# Patient Record
Sex: Female | Born: 1935 | Race: White | Hispanic: No | Smoking: Never smoker
Health system: Southern US, Community
[De-identification: ages and names within clinical notes are randomized; demographics above are authoritative.]

## PROBLEM LIST (undated history)

## (undated) DIAGNOSIS — E079 Disorder of thyroid, unspecified: Secondary | ICD-10-CM

## (undated) DIAGNOSIS — M1990 Unspecified osteoarthritis, unspecified site: Secondary | ICD-10-CM

## (undated) DIAGNOSIS — F0390 Unspecified dementia without behavioral disturbance: Secondary | ICD-10-CM

## (undated) HISTORY — PX: OTHER SURGICAL HISTORY: SHX169

## (undated) HISTORY — PX: ABDOMINAL HYSTERECTOMY: SHX81

## (undated) HISTORY — PX: TONSILLECTOMY: SUR1361

## (undated) HISTORY — PX: EYE SURGERY: SHX253

## (undated) HISTORY — PX: APPENDECTOMY: SHX54

## (undated) HISTORY — PX: CHOLECYSTECTOMY: SHX55

---

## 2005-07-01 ENCOUNTER — Ambulatory Visit: Payer: Self-pay | Admitting: Internal Medicine

## 2006-07-06 ENCOUNTER — Ambulatory Visit: Payer: Self-pay | Admitting: Internal Medicine

## 2006-10-06 ENCOUNTER — Ambulatory Visit: Payer: Self-pay | Admitting: Internal Medicine

## 2007-07-12 ENCOUNTER — Ambulatory Visit: Payer: Self-pay | Admitting: Internal Medicine

## 2007-10-08 ENCOUNTER — Emergency Department: Payer: Self-pay | Admitting: Emergency Medicine

## 2007-10-08 ENCOUNTER — Other Ambulatory Visit: Payer: Self-pay

## 2008-07-12 ENCOUNTER — Ambulatory Visit: Payer: Self-pay | Admitting: Internal Medicine

## 2009-08-06 ENCOUNTER — Ambulatory Visit: Payer: Self-pay | Admitting: Unknown Physician Specialty

## 2009-11-05 ENCOUNTER — Ambulatory Visit: Payer: Self-pay | Admitting: Gastroenterology

## 2010-09-08 ENCOUNTER — Ambulatory Visit: Payer: Self-pay | Admitting: Unknown Physician Specialty

## 2012-09-07 ENCOUNTER — Ambulatory Visit: Payer: Self-pay | Admitting: Physician Assistant

## 2014-01-16 ENCOUNTER — Ambulatory Visit: Payer: Self-pay | Admitting: Physician Assistant

## 2015-03-14 ENCOUNTER — Other Ambulatory Visit: Payer: Self-pay | Admitting: Physician Assistant

## 2015-03-14 DIAGNOSIS — Z1231 Encounter for screening mammogram for malignant neoplasm of breast: Secondary | ICD-10-CM

## 2015-03-28 ENCOUNTER — Ambulatory Visit: Payer: Self-pay

## 2020-05-31 ENCOUNTER — Encounter: Payer: Self-pay | Admitting: Emergency Medicine

## 2020-05-31 ENCOUNTER — Other Ambulatory Visit: Payer: Self-pay

## 2020-05-31 DIAGNOSIS — I5021 Acute systolic (congestive) heart failure: Principal | ICD-10-CM | POA: Diagnosis present

## 2020-05-31 DIAGNOSIS — F0390 Unspecified dementia without behavioral disturbance: Secondary | ICD-10-CM | POA: Diagnosis present

## 2020-05-31 DIAGNOSIS — Z9114 Patient's other noncompliance with medication regimen: Secondary | ICD-10-CM

## 2020-05-31 DIAGNOSIS — N1831 Chronic kidney disease, stage 3a: Secondary | ICD-10-CM | POA: Diagnosis present

## 2020-05-31 DIAGNOSIS — M7989 Other specified soft tissue disorders: Secondary | ICD-10-CM | POA: Diagnosis present

## 2020-05-31 DIAGNOSIS — M1990 Unspecified osteoarthritis, unspecified site: Secondary | ICD-10-CM | POA: Diagnosis present

## 2020-05-31 DIAGNOSIS — Z7982 Long term (current) use of aspirin: Secondary | ICD-10-CM

## 2020-05-31 DIAGNOSIS — H409 Unspecified glaucoma: Secondary | ICD-10-CM | POA: Diagnosis present

## 2020-05-31 DIAGNOSIS — E039 Hypothyroidism, unspecified: Secondary | ICD-10-CM | POA: Diagnosis present

## 2020-05-31 DIAGNOSIS — Z79899 Other long term (current) drug therapy: Secondary | ICD-10-CM

## 2020-05-31 DIAGNOSIS — I509 Heart failure, unspecified: Secondary | ICD-10-CM | POA: Diagnosis not present

## 2020-05-31 DIAGNOSIS — J9601 Acute respiratory failure with hypoxia: Secondary | ICD-10-CM | POA: Diagnosis present

## 2020-05-31 DIAGNOSIS — Z9049 Acquired absence of other specified parts of digestive tract: Secondary | ICD-10-CM

## 2020-05-31 DIAGNOSIS — Z79890 Hormone replacement therapy (postmenopausal): Secondary | ICD-10-CM

## 2020-05-31 DIAGNOSIS — Z20822 Contact with and (suspected) exposure to covid-19: Secondary | ICD-10-CM | POA: Diagnosis present

## 2020-05-31 DIAGNOSIS — I272 Pulmonary hypertension, unspecified: Secondary | ICD-10-CM | POA: Diagnosis present

## 2020-05-31 LAB — CBC
HCT: 37.7 % (ref 36.0–46.0)
Hemoglobin: 12.4 g/dL (ref 12.0–15.0)
MCH: 31.6 pg (ref 26.0–34.0)
MCHC: 32.9 g/dL (ref 30.0–36.0)
MCV: 95.9 fL (ref 80.0–100.0)
Platelets: 225 10*3/uL (ref 150–400)
RBC: 3.93 MIL/uL (ref 3.87–5.11)
RDW: 13.9 % (ref 11.5–15.5)
WBC: 5.4 10*3/uL (ref 4.0–10.5)
nRBC: 0 % (ref 0.0–0.2)

## 2020-05-31 LAB — COMPREHENSIVE METABOLIC PANEL
ALT: 33 U/L (ref 0–44)
AST: 44 U/L — ABNORMAL HIGH (ref 15–41)
Albumin: 4.3 g/dL (ref 3.5–5.0)
Alkaline Phosphatase: 64 U/L (ref 38–126)
Anion gap: 11 (ref 5–15)
BUN: 16 mg/dL (ref 8–23)
CO2: 23 mmol/L (ref 22–32)
Calcium: 9.2 mg/dL (ref 8.9–10.3)
Chloride: 99 mmol/L (ref 98–111)
Creatinine, Ser: 1.02 mg/dL — ABNORMAL HIGH (ref 0.44–1.00)
GFR calc Af Amer: 55 mL/min — ABNORMAL LOW (ref >60)
GFR calc non Af Amer: 47 mL/min — ABNORMAL LOW (ref >60)
Glucose, Bld: 128 mg/dL — ABNORMAL HIGH (ref 70–99)
Potassium: 4.1 mmol/L (ref 3.5–5.1)
Sodium: 133 mmol/L — ABNORMAL LOW (ref 135–145)
Total Bilirubin: 1.5 mg/dL — ABNORMAL HIGH (ref 0.3–1.2)
Total Protein: 7.1 g/dL (ref 6.5–8.1)

## 2020-05-31 LAB — URINALYSIS, COMPLETE (UACMP) WITH MICROSCOPIC
Bilirubin Urine: NEGATIVE
Glucose, UA: NEGATIVE mg/dL
Ketones, ur: 5 mg/dL — AB
Leukocytes,Ua: NEGATIVE
Nitrite: NEGATIVE
Protein, ur: 30 mg/dL — AB
Specific Gravity, Urine: 1.014 (ref 1.005–1.030)
pH: 5 (ref 5.0–8.0)

## 2020-05-31 LAB — BRAIN NATRIURETIC PEPTIDE: B Natriuretic Peptide: 273 pg/mL — ABNORMAL HIGH (ref 0.0–100.0)

## 2020-05-31 MED ORDER — SODIUM CHLORIDE 0.9% FLUSH: 3.0000 mL | Freq: Once | INTRAVENOUS | Status: DC

## 2020-05-31 NOTE — ED Triage Notes (Signed)
Pt to ED via POV with family member who states that she took pt to [facility] today for Leg swelling and AMS. Family member states that pt has had increased confusion over the last 3 weeks. Also noticed today that pt has swelling from her knees to her ankles. Pt has dry cough in triage. Pt is in NAD.

## 2020-06-01 ENCOUNTER — Inpatient Hospital Stay (HOSPITAL_COMMUNITY)
Admit: 2020-06-01 | Discharge: 2020-06-01 | Disposition: A | Discharge status: Home / Self Care | Payer: Medicare Other | Attending: Family Medicine | Admitting: Family Medicine

## 2020-06-01 ENCOUNTER — Encounter: Payer: Self-pay | Admitting: Family Medicine

## 2020-06-01 ENCOUNTER — Inpatient Hospital Stay
Admission: EM | Admit: 2020-06-01 | Discharge: 2020-06-04 | DRG: 291 | Disposition: A | Discharge status: Home Health | Payer: Medicare Other | Attending: Internal Medicine | Admitting: Internal Medicine

## 2020-06-01 ENCOUNTER — Emergency Department: Payer: Medicare Other

## 2020-06-01 DIAGNOSIS — F0390 Unspecified dementia without behavioral disturbance: Secondary | ICD-10-CM | POA: Diagnosis present

## 2020-06-01 DIAGNOSIS — I361 Nonrheumatic tricuspid (valve) insufficiency: Secondary | ICD-10-CM

## 2020-06-01 DIAGNOSIS — E079 Disorder of thyroid, unspecified: Secondary | ICD-10-CM | POA: Diagnosis not present

## 2020-06-01 DIAGNOSIS — Z79890 Hormone replacement therapy (postmenopausal): Secondary | ICD-10-CM | POA: Diagnosis not present

## 2020-06-01 DIAGNOSIS — I509 Heart failure, unspecified: Secondary | ICD-10-CM | POA: Diagnosis present

## 2020-06-01 DIAGNOSIS — I5021 Acute systolic (congestive) heart failure: Secondary | ICD-10-CM | POA: Diagnosis present

## 2020-06-01 DIAGNOSIS — Z9049 Acquired absence of other specified parts of digestive tract: Secondary | ICD-10-CM | POA: Diagnosis not present

## 2020-06-01 DIAGNOSIS — J9601 Acute respiratory failure with hypoxia: Secondary | ICD-10-CM

## 2020-06-01 DIAGNOSIS — R0902 Hypoxemia: Secondary | ICD-10-CM

## 2020-06-01 DIAGNOSIS — I351 Nonrheumatic aortic (valve) insufficiency: Secondary | ICD-10-CM

## 2020-06-01 DIAGNOSIS — E039 Hypothyroidism, unspecified: Secondary | ICD-10-CM | POA: Diagnosis present

## 2020-06-01 DIAGNOSIS — N1831 Chronic kidney disease, stage 3a: Secondary | ICD-10-CM | POA: Diagnosis present

## 2020-06-01 DIAGNOSIS — I272 Pulmonary hypertension, unspecified: Secondary | ICD-10-CM | POA: Diagnosis present

## 2020-06-01 DIAGNOSIS — M7989 Other specified soft tissue disorders: Secondary | ICD-10-CM | POA: Diagnosis present

## 2020-06-01 DIAGNOSIS — Z79899 Other long term (current) drug therapy: Secondary | ICD-10-CM | POA: Diagnosis not present

## 2020-06-01 DIAGNOSIS — Z7982 Long term (current) use of aspirin: Secondary | ICD-10-CM | POA: Diagnosis not present

## 2020-06-01 DIAGNOSIS — Z9114 Patient's other noncompliance with medication regimen: Secondary | ICD-10-CM | POA: Diagnosis not present

## 2020-06-01 DIAGNOSIS — H409 Unspecified glaucoma: Secondary | ICD-10-CM | POA: Diagnosis present

## 2020-06-01 DIAGNOSIS — Z20822 Contact with and (suspected) exposure to covid-19: Secondary | ICD-10-CM | POA: Diagnosis present

## 2020-06-01 DIAGNOSIS — I340 Nonrheumatic mitral (valve) insufficiency: Secondary | ICD-10-CM

## 2020-06-01 DIAGNOSIS — M1990 Unspecified osteoarthritis, unspecified site: Secondary | ICD-10-CM | POA: Diagnosis present

## 2020-06-01 HISTORY — DX: Unspecified osteoarthritis, unspecified site: M19.90

## 2020-06-01 HISTORY — DX: Disorder of thyroid, unspecified: E07.9

## 2020-06-01 HISTORY — DX: Unspecified dementia, unspecified severity, without behavioral disturbance, psychotic disturbance, mood disturbance, and anxiety: F03.90

## 2020-06-01 LAB — HEPATIC FUNCTION PANEL
ALT: 33 U/L (ref 0–44)
AST: 47 U/L — ABNORMAL HIGH (ref 15–41)
Albumin: 4.5 g/dL (ref 3.5–5.0)
Alkaline Phosphatase: 67 U/L (ref 38–126)
Bilirubin, Direct: 0.5 mg/dL — ABNORMAL HIGH (ref 0.0–0.2)
Indirect Bilirubin: 1.7 mg/dL — ABNORMAL HIGH (ref 0.3–0.9)
Total Bilirubin: 2.2 mg/dL — ABNORMAL HIGH (ref 0.3–1.2)
Total Protein: 7.2 g/dL (ref 6.5–8.1)

## 2020-06-01 LAB — ECHOCARDIOGRAM COMPLETE
AR max vel: 1.94 cm2
AV Area VTI: 1.78 cm2
AV Area mean vel: 1.92 cm2
AV Mean grad: 2 mmHg
AV Peak grad: 3.4 mmHg
Ao pk vel: 0.92 m/s
S' Lateral: 3.66 cm

## 2020-06-01 LAB — MAGNESIUM: Magnesium: 2.1 mg/dL (ref 1.7–2.4)

## 2020-06-01 LAB — SARS CORONAVIRUS 2 BY RT PCR (HOSPITAL ORDER, PERFORMED IN ~~LOC~~ HOSPITAL LAB): SARS Coronavirus 2: NEGATIVE

## 2020-06-01 LAB — TSH: TSH: 2.299 u[IU]/mL (ref 0.350–4.500)

## 2020-06-01 LAB — AMMONIA: Ammonia: 11 umol/L (ref 9–35)

## 2020-06-01 LAB — VITAMIN B12: Vitamin B-12: 2956 pg/mL — ABNORMAL HIGH (ref 180–914)

## 2020-06-01 LAB — RPR: RPR Ser Ql: NONREACTIVE

## 2020-06-01 MED ORDER — HALOPERIDOL LACTATE 5 MG/ML IJ SOLN: 1.0000 mg | Freq: Four times a day (QID) | INTRAMUSCULAR | Status: DC | PRN

## 2020-06-01 MED ORDER — ASPIRIN EC 81 MG PO TBEC
81.0000 mg | DELAYED_RELEASE_TABLET | Freq: Every day | ORAL | Status: DC
  Administered 2020-06-01 – 2020-06-04 (×4): 81 mg via ORAL
  Filled 2020-06-01 (×4): qty 1

## 2020-06-01 MED ORDER — BRIMONIDINE TARTRATE 0.2 % OP SOLN
1.0000 [drp] | Freq: Two times a day (BID) | OPHTHALMIC | Status: DC
  Administered 2020-06-01 – 2020-06-04 (×7): 1 [drp] via OPHTHALMIC
  Filled 2020-06-01: qty 5

## 2020-06-01 MED ORDER — FUROSEMIDE 40 MG PO TABS
40.0000 mg | ORAL_TABLET | Freq: Two times a day (BID) | ORAL | Status: DC
  Administered 2020-06-01 – 2020-06-02 (×2): 40 mg via ORAL
  Filled 2020-06-01 (×2): qty 1

## 2020-06-01 MED ORDER — LEVOTHYROXINE SODIUM 50 MCG PO TABS
50.0000 ug | ORAL_TABLET | Freq: Every morning | ORAL | Status: DC
  Administered 2020-06-01 – 2020-06-04 (×4): 50 ug via ORAL
  Filled 2020-06-01 (×4): qty 1

## 2020-06-01 MED ORDER — ENOXAPARIN SODIUM 40 MG/0.4ML ~~LOC~~ SOLN
40.0000 mg | SUBCUTANEOUS | Status: DC
  Administered 2020-06-01 – 2020-06-02 (×2): 40 mg via SUBCUTANEOUS
  Filled 2020-06-01: qty 0.4

## 2020-06-01 MED ORDER — SODIUM CHLORIDE 0.9 % IV SOLN: 250.0000 mL | INTRAVENOUS | Status: DC | PRN

## 2020-06-01 MED ORDER — BRIMONIDINE TARTRATE-TIMOLOL 0.2-0.5 % OP SOLN
1.0000 [drp] | Freq: Two times a day (BID) | OPHTHALMIC | Status: DC
  Filled 2020-06-01: qty 5

## 2020-06-01 MED ORDER — HALOPERIDOL 1 MG PO TABS
1.0000 mg | ORAL_TABLET | Freq: Four times a day (QID) | ORAL | Status: DC | PRN
  Administered 2020-06-01: 1 mg via ORAL
  Filled 2020-06-01 (×2): qty 1

## 2020-06-01 MED ORDER — TIMOLOL MALEATE 0.5 % OP SOLN
1.0000 [drp] | Freq: Two times a day (BID) | OPHTHALMIC | Status: DC
  Administered 2020-06-01 – 2020-06-04 (×7): 1 [drp] via OPHTHALMIC
  Filled 2020-06-01: qty 5

## 2020-06-01 MED ORDER — SODIUM CHLORIDE 0.9% FLUSH: 3.0000 mL | INTRAVENOUS | Status: DC | PRN

## 2020-06-01 MED ORDER — ACETAMINOPHEN 325 MG PO TABS
650.0000 mg | ORAL_TABLET | ORAL | Status: DC | PRN
  Filled 2020-06-01: qty 2

## 2020-06-01 MED ORDER — SODIUM CHLORIDE 0.9% FLUSH
3.0000 mL | Freq: Two times a day (BID) | INTRAVENOUS | Status: DC
  Administered 2020-06-03 – 2020-06-04 (×2): 3 mL via INTRAVENOUS

## 2020-06-01 MED ORDER — FUROSEMIDE 10 MG/ML IJ SOLN
20.0000 mg | Freq: Once | INTRAMUSCULAR | Status: AC
  Administered 2020-06-01: 20 mg via INTRAVENOUS
  Filled 2020-06-01: qty 4

## 2020-06-01 MED ORDER — FUROSEMIDE 10 MG/ML IJ SOLN: 40.0000 mg | Freq: Two times a day (BID) | INTRAMUSCULAR | Status: DC

## 2020-06-01 MED ORDER — ONDANSETRON HCL 4 MG/2ML IJ SOLN: 4.0000 mg | Freq: Four times a day (QID) | INTRAMUSCULAR | Status: DC | PRN

## 2020-06-01 NOTE — ED Notes (Signed)
Attempt to call report, no rn available.  

## 2020-06-01 NOTE — H&P (Signed)
History and Physical    Tyreisha Ungar YOV:[id number] DOB: [birth year] DOA: 06/01/2020  PCP: Patrice Paradise, MD   Patient coming from: [facility]   Chief Complaint: Increased confusion, leg swelling, SOB   HPI: Kaydon Creedon is a 86 y.o. female with medical history significant for dementia, hypothyroidism, and arthritis, now presenting to the emergency department from her independent living facility with increased confusion, bilateral leg swelling, and shortness of breath.  She is accompanied by her daughter who assists with the history.  Patient has been more confused than usual for the past few weeks, reports that she has been unable to interact with her friends at the ILF due to Covid restrictions, her daughter worries that she has not been taking her medications regularly, and notes that the patient began to complain of bilateral leg swelling approximately a week ago.  Patient also reported some shortness of breath recently.  There has not been any documented fevers and the patient denies chills.  Patient denies any chest pain at this time but does not remember if she has had any recently.  She denies abdominal pain, back pain, or dysuria.  ED Course: Upon arrival to the ED, patient is found to be afebrile, desaturating to the mid 80s with minimal exertion, and with stable blood pressure.  EKG features sinus rhythm with PACs.  Chest x-ray is concerning for cardiomegaly with interstitial edema and small pleural effusions.  Chemistry panel is notable for creatinine 1.02, similar to priors.  CBC is unremarkable.  BNP is elevated to 273.  Patient was given 20 mg IV Lasix in the ED.  COVID-19 screening test not yet resulted.  Review of Systems:  ROS is limited by the patient's clinical condition.  Past Medical History:  Diagnosis Date  . Arthritis   . Dementia (HCC)   . Thyroid disease     Past Surgical History:  Procedure Laterality Date  . ABDOMINAL HYSTERECTOMY    . APPENDECTOMY    .  bladder    . CHOLECYSTECTOMY    . EYE SURGERY    . TONSILLECTOMY      Social History:   reports that she has never smoked. She has never used smokeless tobacco. She reports previous alcohol use. She reports previous drug use.  No Known Allergies  History reviewed. No pertinent family history.   Prior to Admission medications   Medication Sig Start Date End Date Taking? Authorizing Provider  aspirin 81 MG EC tablet Take 81 mg by mouth daily.   Yes [provider]  Ca Phosphate-Cholecalciferol 250-400 MG-UNIT CHEW Chew 1 tablet by mouth daily.    Yes [provider]  Cholecalciferol 25 MCG (1000 UT) tablet Take 1,000 Units by mouth daily.   Yes [provider]  COMBIGAN 0.2-0.5 % ophthalmic solution Apply 1 drop to eye 2 (two) times daily. 04/24/20  Yes [provider]  conjugated estrogens (PREMARIN) vaginal cream INSERT 1 GRAM VAGINALLY TWICE A WEEK 06/10/16  Yes [provider]  cyanocobalamin 1000 MCG tablet Take 1,000 mcg by mouth daily.    Yes [provider]  levothyroxine (SYNTHROID) 50 MCG tablet Take 50 mcg by mouth every morning. 04/25/20  Yes [provider]    Physical Exam: Vitals:   05/31/20 1818 06/01/20 0302 06/01/20 0338 06/01/20 0421  BP:  (!) 150/99  126/78  Pulse:  (!) 114 86 97  Resp:  (!) 25 20 (!) 24  Temp:    98.4 F (36.9 C)  TempSrc:  SpO2:  98% 93% 93%  Weight: 68 kg     Height: 5\' 6"  (1.676 m)       Constitutional: NAD, calm  Eyes: PERTLA, lids and conjunctivae normal ENMT: Mucous membranes are moist. Posterior pharynx clear of any exudate or lesions.   Neck: normal, supple, no masses, no thyromegaly Respiratory: Fine rales bilaterally, dyspnea with speech. No pallor or cyanosis.    Cardiovascular: S1 & S2 heard, regular rate and rhythm. Pretibial pitting edema bilaterally.  Abdomen: No distension, no tenderness, soft. Bowel sounds active.  Musculoskeletal: no clubbing / cyanosis.  No joint deformity upper and lower extremities.   Skin: no significant rashes, lesions, ulcers. Warm, dry, well-perfused. Neurologic: CN 2-12 grossly intact. Sensation to light touch intact. Moving all extremities.  Psychiatric: Alert and oriented to person and place only. Very pleasant and cooperative.    Labs and Imaging on Admission: I have personally reviewed following labs and imaging studies  CBC: Recent Labs  Lab 05/31/20 1828  WBC 5.4  HGB 12.4  HCT 37.7  MCV 95.9  PLT 225   Basic Metabolic Panel: Recent Labs  Lab 05/31/20 1828  NA 133*  K 4.1  CL 99  CO2 23  GLUCOSE 128*  BUN 16  CREATININE 1.02*  CALCIUM 9.2   GFR: Estimated Creatinine Clearance: 32.3 mL/min (A) (by C-G formula based on SCr of 1.02 mg/dL (H)). Liver Function Tests: Recent Labs  Lab 05/31/20 1828  AST 44*  ALT 33  ALKPHOS 64  BILITOT 1.5*  PROT 7.1  ALBUMIN 4.3   No results for input(s): LIPASE, AMYLASE in the last 168 hours. No results for input(s): AMMONIA in the last 168 hours. Coagulation Profile: No results for input(s): INR, PROTIME in the last 168 hours. Cardiac Enzymes: No results for input(s): CKTOTAL, CKMB, CKMBINDEX, TROPONINI in the last 168 hours. BNP (last 3 results) No results for input(s): PROBNP in the last 8760 hours. HbA1C: No results for input(s): HGBA1C in the last 72 hours. CBG: No results for input(s): GLUCAP in the last 168 hours. Lipid Profile: No results for input(s): CHOL, HDL, LDLCALC, TRIG, CHOLHDL, LDLDIRECT in the last 72 hours. Thyroid Function Tests: No results for input(s): TSH, T4TOTAL, FREET4, T3FREE, THYROIDAB in the last 72 hours. Anemia Panel: No results for input(s): VITAMINB12, FOLATE, FERRITIN, TIBC, IRON, RETICCTPCT in the last 72 hours. Urine analysis:    Component Value Date/Time   COLORURINE YELLOW (A) 05/31/2020 1828   APPEARANCEUR HAZY (A) 05/31/2020 1828   LABSPEC 1.014 05/31/2020 1828   PHURINE 5.0 05/31/2020 1828    GLUCOSEU NEGATIVE 05/31/2020 1828   HGBUR SMALL (A) 05/31/2020 1828   BILIRUBINUR NEGATIVE 05/31/2020 1828   KETONESUR 5 (A) 05/31/2020 1828   PROTEINUR 30 (A) 05/31/2020 1828   NITRITE NEGATIVE 05/31/2020 1828   LEUKOCYTESUR NEGATIVE 05/31/2020 1828   Sepsis Labs: @LABRCNTIP (procalcitonin:4,lacticidven:4) ) Recent Results (from the past 240 hour(s))  SARS Coronavirus 2 by RT PCR (hospital order, performed in [facility] hospital lab) Nasopharyngeal Nasopharyngeal Swab     Status: None   Collection Time: 06/01/20  2:37 AM   Specimen: Nasopharyngeal Swab  Result Value Ref Range Status   SARS Coronavirus 2 NEGATIVE NEGATIVE Final    Comment: (NOTE) SARS-CoV-2 target nucleic acids are NOT DETECTED.  The SARS-CoV-2 RNA is generally detectable in upper and lower respiratory specimens during the acute phase of infection. The lowest concentration of SARS-CoV-2 viral copies this assay can detect is 250 copies / mL. A negative result does not  preclude SARS-CoV-2 infection and should not be used as the sole basis for treatment or other patient management decisions.  A negative result may occur with improper specimen collection / handling, submission of specimen other than nasopharyngeal swab, presence of viral mutation(s) within the areas targeted by this assay, and inadequate number of viral copies (<250 copies / mL). A negative result must be combined with clinical observations, patient history, and epidemiological information.  Fact Sheet for Patients:   BoilerBrush.com.cy  Fact Sheet for Healthcare Providers: https://pope.com/  This test is not yet approved or  cleared by the [place] FDA and has been authorized for detection and/or diagnosis of SARS-CoV-2 by FDA under an Emergency Use Authorization (EUA).  This EUA will remain in effect (meaning this test can be used) for the duration of the COVID-19 declaration under Section  564(b)(1) of the Act, 21 U.S.C. section 360bbb-3(b)(1), unless the authorization is terminated or revoked sooner.  Performed at [facility], [address] [place], [place] [address]      Radiological Exams on Admission: DG Chest 2 View  Result Date: 06/01/2020 CLINICAL DATA:  Cough, volume overload EXAM: CHEST - 2 VIEW COMPARISON:  None. FINDINGS: There is mild cardiomegaly. Aortic knob calcifications are seen. Diffusely increased interstitial markings are seen throughout both lungs. There are small bilateral pleural effusions. No acute osseous abnormality. IMPRESSION: Cardiomegaly and interstitial edema. Small bilateral pleural effusions. Electronically Signed   By: Jonna Clark M.D.   On: 06/01/2020 01:13    EKG: Independently reviewed. Sinus rhythm, PACs.   Assessment/Plan   1. Acute CHF  - Patient presents with worsening confusion over a few weeks, new b/l leg swelling, and SOB, and is found to have elevated BNP, peripheral edema, hypoxia with minimal exertion, and CHF-findings on CXR  - She was given 20 mg IV Lasix in ED but has not urinated much  - Diurese with Lasix 40 mg IV q12h, follow I/Os and daily weight, check echocardiogram, and monitor electrolytes and renal function during diuresis   2. Dementia; increased confusion  - Patient has history of dementia but has been increasingly confused in the last few weeks per report of daughter  - No focal deficits identified, worsening could be related to new CHF and poor oxygenation, progression in her dementia, not taking her medications regularly (as daughter suspects), or not being able to interact with friends at her ILF due to COVID restrictions (patient reports being lonely)  - Check TSH, ammonia, B12, folate, RPR, and institute delirium precautions    3. CKD IIIa  - SCr is 1.02 on admission, similar to priors  - Monitor closely while diuresing   4. Hypothyroidism  - Patient has increased confusion last few  weeks, daughter worried she has not been taking her medications regularly so TSH is being checked  - Continue Synthroid    DVT prophylaxis: Lovenox  Code Status: Full for now. Patient confused and daughter at bedside wants her to be full code for now while she checks the patient's advance directives that she has at home  Family Communication: Daughter updated at bedside  Disposition Plan:  Patient is from: ILF  Anticipated d/c is to: TBD Anticipated d/c date is: 06/03/20  Patient currently: Hypoxic with minimal exertion secondary to new CHF and is pending diuresis and echocardiogram  Consults called: None  Admission status: Inpatient     Briscoe Deutscher, MD [facility]  06/01/2020, 4:39 AM

## 2020-06-01 NOTE — ED Notes (Signed)
Patient ambulated with pulse oximetry monitor per MD Don Perking request. Patient's respiratory rate increased from 17 to 30 breaths per minute. Patient's oxygen saturation rate decreased from 97% on RA to 84%. Ambulation stopped, patient placed in wheelchair and rolled back to room. MD notified.

## 2020-06-01 NOTE — ED Provider Notes (Signed)
[facility] Emergency Department Provider Note  ____________________________________________  Time seen: Approximately 12:50 AM  I have reviewed the triage vital signs and the nursing notes.   HISTORY  Chief Complaint Altered Mental Status and Leg Swelling   HPI Jordan Howard is a 86 y.o. female with a history of dementia and hypothyroidism who presents for evaluation of confusion and leg swelling.  Patient is here with her daughter who provides most of the history.  According to the daughter patient has a history of dementia but has been more confused over the last 3 weeks.  She went to visit her today and noticed that her legs were very swollen.  Patient was also complaining of shortness of breath when she walks more than 2 blocks.  No orthopnea.  Patient has noticed that her abdominal girth has increased and her pants feel tight on her.  No prior history of CHF.  No pain in the legs, no chest pain, no prior history of PE or DVT.  No dysuria or hematuria, no nausea or vomiting, no fever or chills.   Past Medical History:  Diagnosis Date   Arthritis    Dementia (HCC)    Thyroid disease     Allergies Patient has no known allergies.  No family history on file.  Social History Social History   Tobacco Use   Smoking status: Never Smoker   Smokeless tobacco: Never Used  Substance Use Topics   Alcohol use: Not Currently   Drug use: Not Currently    Review of Systems  Constitutional: Negative for fever. + confusion Eyes: Negative for visual changes. ENT: Negative for sore throat. Neck: No neck pain  Cardiovascular: Negative for chest pain. Respiratory: Negative for shortness of breath. Gastrointestinal: Negative for abdominal pain, vomiting or diarrhea. Genitourinary: Negative for dysuria. Musculoskeletal: Negative for back pain. + b/l leg swelling Skin: Negative for rash. Neurological: Negative for headaches, weakness or  numbness. Psych: No SI or HI  ____________________________________________   PHYSICAL EXAM:  VITAL SIGNS: ED Triage Vitals  Enc Vitals Group     BP 05/31/20 1817 (!) 155/64     Pulse Rate 05/31/20 1817 60     Resp 05/31/20 1817 16     Temp 05/31/20 1817 98.3 F (36.8 C)     Temp Source 05/31/20 1817 Oral     SpO2 05/31/20 1817 95 %     Weight 05/31/20 1818 150 lb (68 kg)     Height 05/31/20 1818 5\' 6"  (1.676 m)     Head Circumference --      Peak Flow --      Pain Score 05/31/20 1818 0     Pain Loc --      Pain Edu? --      Excl. in GC? --     Constitutional: Alert and oriented x2. Well appearing and in no apparent distress. HEENT:      Head: Normocephalic and atraumatic.         Eyes: Conjunctivae are normal. Sclera is non-icteric.       Mouth/Throat: Mucous membranes are moist.       Neck: Supple with no signs of meningismus. Cardiovascular: Regular rate and rhythm. No murmurs, gallops, or rubs.  Respiratory: Normal respiratory effort. Decreased breath sounds on the R base with faint crackles. Normal sats Gastrointestinal: Soft, non tender. Musculoskeletal: 2+ pitting edema bilaterally but no erythema or warmth. Neurologic: Normal speech and language. Face is symmetric. Moving all extremities. No gross focal  neurologic deficits are appreciated. Skin: Skin is warm, dry and intact. No rash noted. Psychiatric: Mood and affect are normal. Speech and behavior are normal.  ____________________________________________   LABS (all labs ordered are listed, but only abnormal results are displayed)  Labs Reviewed  COMPREHENSIVE METABOLIC PANEL - Abnormal; Notable for the following components:      Result Value   Sodium 133 (*)    Glucose, Bld 128 (*)    Creatinine, Ser 1.02 (*)    AST 44 (*)    Total Bilirubin 1.5 (*)    GFR calc non Af Amer 47 (*)    GFR calc Af Amer 55 (*)    All other components within normal limits  URINALYSIS, COMPLETE (UACMP) WITH MICROSCOPIC -  Abnormal; Notable for the following components:   Color, Urine YELLOW (*)    APPearance HAZY (*)    Hgb urine dipstick SMALL (*)    Ketones, ur 5 (*)    Protein, ur 30 (*)    Bacteria, UA RARE (*)    All other components within normal limits  BRAIN NATRIURETIC PEPTIDE - Abnormal; Notable for the following components:   B Natriuretic Peptide 273.0 (*)    All other components within normal limits  URINE CULTURE  SARS CORONAVIRUS 2 BY RT PCR (HOSPITAL ORDER, PERFORMED IN [facility] LAB)  CBC  CBG MONITORING, ED   ____________________________________________  EKG  ED ECG REPORT I, Nita Sickle, the attending physician, personally viewed and interpreted this ECG.  Normal sinus rhythm, rate of 79, prolonged QTC, right axis deviation, anterior Q waves, occasional PACs, no ST elevation ____________________________________________  RADIOLOGY  I have personally reviewed the images performed during this visit and I agree with the Radiologist's read.   Interpretation by Radiologist:  DG Chest 2 View  Result Date: 06/01/2020 CLINICAL DATA:  Cough, volume overload EXAM: CHEST - 2 VIEW COMPARISON:  None. FINDINGS: There is mild cardiomegaly. Aortic knob calcifications are seen. Diffusely increased interstitial markings are seen throughout both lungs. There are small bilateral pleural effusions. No acute osseous abnormality. IMPRESSION: Cardiomegaly and interstitial edema. Small bilateral pleural effusions. Electronically Signed   By: Jonna Clark M.D.   On: 06/01/2020 01:13      ____________________________________________   PROCEDURES  Procedure(s) performed:yes  .1-3 Lead EKG Interpretation Performed by: Nita Sickle, MD Authorized by: Nita Sickle, MD     Interpretation: non-specific     ECG rate assessment: normal     Rhythm: sinus rhythm     Ectopy: PAC     Critical Care performed: yes  CRITICAL CARE Performed by: Nita Sickle  ?  Total critical care time: 35 min  Critical care time was exclusive of separately billable procedures and treating other patients.  Critical care was necessary to treat or prevent imminent or life-threatening deterioration.  Critical care was time spent personally by me on the following activities: development of treatment plan with patient and/or surrogate as well as nursing, discussions with consultants, evaluation of patient's response to treatment, examination of patient, obtaining history from patient or surrogate, ordering and performing treatments and interventions, ordering and review of laboratory studies, ordering and review of radiographic studies, pulse oximetry and re-evaluation of patient's condition.  ____________________________________________   INITIAL IMPRESSION / ASSESSMENT AND PLAN / ED COURSE   86 y.o. female with a history of dementia and hypothyroidism who presents for evaluation of confusion and leg swelling.  Patient looks volume overloaded with faint crackles and decreased breath sounds on  the right base, 2+ pitting edema bilateral lower extremity, and increased abdominal girth.  At this time most likely new CHF diagnosis.  Low suspicion for DVT based on history and the fact that both legs are equally swollen.  Kidney function is at baseline.  UA with no signs of UTI.  No signs of sepsis.  No significant electrolyte derangements.  BUN is normal.  BNP is elevated at 273 which agrees with the diagnosis.  We will get a chest x-ray and ambulate.  If patient is hypoxic with ambulation will admit otherwise we will start her on Lasix and have her follow-up with cardiology as an outpatient.  Discussed the plan with patient and her daughter who is at bedside and they are comfortable with it.  Old medical records reviewed.  History gathered mostly from daughter.     _________________________ 1:57 AM on 06/01/2020 -----------------------------------------  Chest  x-ray with cardiomegaly, pulmonary edema, bilateral pleural effusions.  With ambulation patient desatted to 84%.  At rest she is satting in the mid 90s.  Will give IV Lasix and get patient admitted to the hospitalist service for further evaluation.  _____________________________________________ Please note:  Patient was evaluated in Emergency Department today for the symptoms described in the history of present illness. Patient was evaluated in the context of the global COVID-19 pandemic, which necessitated consideration that the patient might be at risk for infection with the SARS-CoV-2 virus that causes COVID-19. Institutional protocols and algorithms that pertain to the evaluation of patients at risk for COVID-19 are in a state of rapid change based on information released by regulatory bodies including the CDC and federal and state organizations. These policies and algorithms were followed during the patient's care in the ED.  Some ED evaluations and interventions may be delayed as a result of limited staffing during the pandemic.   [place] Controlled Substance Database was reviewed by me. ____________________________________________   FINAL CLINICAL IMPRESSION(S) / ED DIAGNOSES   Final diagnoses:  New onset of congestive heart failure (HCC)  Acute respiratory failure with hypoxia (HCC)      NEW MEDICATIONS STARTED DURING THIS VISIT:  ED Discharge Orders    None       Note:  This document was prepared using Dragon voice recognition software and may include unintentional dictation errors.    Don Perking, [place], MD 06/01/20 [phone]

## 2020-06-01 NOTE — Progress Notes (Signed)
86 [place] history of [place] admitted with increased confusion shortness of breath and lower extremity edema.  Chest x-ray consistent with fluid overload.  Admitted with CHF unknown if it systolic or diastolic echo is pending.  Continue diuretics.  UA is negative.  Discussed with her daughter Sedalia Muta and updated her phone number in the system. Patient has been having visual hallucinations which is new per daughter, pulling out oxygen and leads an IV this morning Haldol given. Echo done today results are pending.  Daughter would like her to go back to same independent living facility.

## 2020-06-01 NOTE — ED Notes (Signed)
Patient's oxygen saturation decreased to 91% on RA. Patient placed no oxygen 2L via nasal cannula. RN will continue to monitor.

## 2020-06-01 NOTE — Plan of Care (Signed)

## 2020-06-01 NOTE — ED Notes (Signed)
Transport to floor room 124.AS

## 2020-06-02 ENCOUNTER — Inpatient Hospital Stay: Payer: Medicare Other

## 2020-06-02 LAB — BASIC METABOLIC PANEL
Anion gap: 12 (ref 5–15)
BUN: 19 mg/dL (ref 8–23)
CO2: 24 mmol/L (ref 22–32)
Calcium: 8.7 mg/dL — ABNORMAL LOW (ref 8.9–10.3)
Chloride: 99 mmol/L (ref 98–111)
Creatinine, Ser: 1.15 mg/dL — ABNORMAL HIGH (ref 0.44–1.00)
GFR calc Af Amer: 47 mL/min — ABNORMAL LOW (ref >60)
GFR calc non Af Amer: 41 mL/min — ABNORMAL LOW (ref >60)
Glucose, Bld: 121 mg/dL — ABNORMAL HIGH (ref 70–99)
Potassium: 3.5 mmol/L (ref 3.5–5.1)
Sodium: 135 mmol/L (ref 135–145)

## 2020-06-02 LAB — URINE CULTURE

## 2020-06-02 MED ORDER — FUROSEMIDE 40 MG PO TABS
40.0000 mg | ORAL_TABLET | Freq: Every day | ORAL | Status: DC
  Filled 2020-06-02: qty 1

## 2020-06-02 MED ORDER — HALOPERIDOL 2 MG PO TABS
2.0000 mg | ORAL_TABLET | Freq: Four times a day (QID) | ORAL | Status: DC | PRN
  Filled 2020-06-02: qty 1

## 2020-06-02 MED ORDER — FUROSEMIDE 20 MG PO TABS
20.0000 mg | ORAL_TABLET | Freq: Every day | ORAL | Status: DC
  Administered 2020-06-03 – 2020-06-04 (×2): 20 mg via ORAL
  Filled 2020-06-02 (×2): qty 1

## 2020-06-02 MED ORDER — QUETIAPINE FUMARATE 25 MG PO TABS
25.0000 mg | ORAL_TABLET | Freq: Every day | ORAL | Status: DC
  Administered 2020-06-02: 21:00:00 25 mg via ORAL
  Filled 2020-06-02: qty 1

## 2020-06-02 MED ORDER — NYSTATIN 100000 UNIT/GM EX POWD
Freq: Two times a day (BID) | CUTANEOUS | Status: DC
  Filled 2020-06-02: qty 15

## 2020-06-02 MED ORDER — HALOPERIDOL LACTATE 5 MG/ML IJ SOLN
1.0000 mg | Freq: Four times a day (QID) | INTRAMUSCULAR | Status: DC | PRN
  Administered 2020-06-04: 01:00:00 1 mg via INTRAMUSCULAR
  Filled 2020-06-02: qty 1

## 2020-06-02 NOTE — Progress Notes (Signed)
PROGRESS NOTE    Jordan Howard  YKD:[id number] DOB: Nov 19, 1924 DOA: 06/01/2020 PCP: Patrice Paradise, MD    Brief Narrative: Jordan Howard is a 86 y.o. female with medical history significant for dementia, hypothyroidism, and arthritis, now presenting to the emergency department from her independent living facility with increased confusion, bilateral leg swelling, and shortness of breath.  She is accompanied by her daughter who assists with the history.  Patient has been more confused than usual for the past few weeks, reports that she has been unable to interact with her friends at the ILF due to Covid restrictions, her daughter worries that she has not been taking her medications regularly, and notes that the patient began to complain of bilateral leg swelling approximately a week ago.  Patient also reported some shortness of breath recently.  There has not been any documented fevers and the patient denies chills.  Patient denies any chest pain at this time but does not remember if she has had any recently.  She denies abdominal pain, back pain, or dysuria.  ED Course: Upon arrival to the ED, patient is found to be afebrile, desaturating to the mid 80s with minimal exertion, and with stable blood pressure.  EKG features sinus rhythm with PACs.  Chest x-ray is concerning for cardiomegaly with interstitial edema and small pleural effusions.  Chemistry panel is notable for creatinine 1.02, similar to priors.  CBC is unremarkable.  BNP is elevated to 273.  Patient was given 20 mg IV Lasix in the ED.  COVID-19 screening test not yet resulted.  Assessment & Plan:   Principal Problem:   New onset of congestive heart failure (HCC) Active Problems:   Dementia (HCC)   Thyroid disease   Chronic kidney disease, stage 3a   #1 Acute systolic CHF Failure-new onset. Continue diuretics at a lower dose.  No I's and O's or weight have been checked. We will start lisinopril 2.5 mg daily decrease Lasix to  40 mg daily.  Her blood pressure is running soft. PT consult  pending  #2 history of dementia with increasing behaviors has been on Haldol which has not helped per nursing staff.  Start her on Seroquel 12.5 nightly.  #3 CKD stage IIIa monitor closely on diuretics and ACE.Cr 1.15 from 1.02 yesterday.  #4 hypothyroidism her TSH is 2.2 continue current dose of Synthroid.  #5 glaucoma continue Alphagan and Timoptic drops    Estimated body mass index is 24.21 kg/m as calculated from the following:   Height as of this encounter: 5\' 6"  (1.676 m).   Weight as of this encounter: 68 kg.  DVT prophylaxis: Lovenox Code Status: Full code Family Communication: Discussed with daughter Diane in detail  Disposition Plan:  Status is: Inpatient  Dispo: The patient is from: Memory care              Anticipated d/c is to: Unknown              Anticipated d/c date is: 2 days              Patient currently is not medically stable to d/c.  Admitted with acute systolic CHF exacerbation new onset   Consultants:   None  Procedures: None Antimicrobials: None  Subjective: Patient is asleep overnight staff reported that she was confused and delirious overnight with no sleep.  She has been on room air saturating above 92%.  Telemetry was DC'd yesterday since she was taking it out all the time.  She  does not have an IV line and she pulled out her IVs.  Objective: Vitals:   06/01/20 1222 06/01/20 1748 06/01/20 2009 06/02/20 0833  BP: (!) 124/88 125/82 (!) 144/74 101/67  Pulse: 69 77 78 91  Resp: Temp: (!) 97.5 F (36.4 C) (!) 97.4 F (36.3 C) 97.7 F (36.5 C) 97.6 F (36.4 C)  TempSrc: Oral Oral Oral Oral  SpO2: 98% 98% 100% 93%  Weight:      Height:        Intake/Output Summary (Last 24 hours) at 06/02/2020 1103 Last data filed at 06/02/2020 1610 Gross per 24 hour  Intake 200 ml  Output --  Net 200 ml   Filed Weights   05/31/20 1818  Weight: 68 kg     Examination:  General exam: Appears calm and comfortable  Respiratory system: Crackles b/l to auscultation. Respiratory effort normal. Cardiovascular system: S1 & S2 heard, RRR. No JVD, murmurs, rubs, gallops or clicks. No pedal edema. Gastrointestinal system: Abdomen is nondistended, soft and nontender. No organomegaly or masses felt. Normal bowel sounds heard. Central nervous system: Alert and oriented. No focal neurological deficits. Extremities: Symmetric 5 x 5 power. Skin: No rashes, lesions or ulcers Psychiatry: Judgement and insight appear normal. Mood & affect appropriate.   Data Reviewed: I have personally reviewed following labs and imaging studies  CBC: Recent Labs  Lab 05/31/20 1828  WBC 5.4  HGB 12.4  HCT 37.7  MCV 95.9  PLT 225   Basic Metabolic Panel: Recent Labs  Lab 05/31/20 1828 06/01/20 0545 06/02/20 0700  NA 133*  --  135  K 4.1  --  3.5  CL 99  --  99  CO2 23  --  24  GLUCOSE 128*  --  121*  BUN 16  --  19  CREATININE 1.02*  --  1.15*  CALCIUM 9.2  --  8.7*  MG  --  2.1  --    GFR: Estimated Creatinine Clearance: 28.6 mL/min (A) (by C-G formula based on SCr of 1.15 mg/dL (H)). Liver Function Tests: Recent Labs  Lab 05/31/20 1828 06/01/20 0545  AST 44* 47*  ALT 33 33  ALKPHOS 64 67  BILITOT 1.5* 2.2*  PROT 7.1 7.2  ALBUMIN 4.3 4.5   No results for input(s): LIPASE, AMYLASE in the last 168 hours. Recent Labs  Lab 06/01/20 0545  AMMONIA 11   Coagulation Profile: No results for input(s): INR, PROTIME in the last 168 hours. Cardiac Enzymes: No results for input(s): CKTOTAL, CKMB, CKMBINDEX, TROPONINI in the last 168 hours. BNP (last 3 results) No results for input(s): PROBNP in the last 8760 hours. HbA1C: No results for input(s): HGBA1C in the last 72 hours. CBG: No results for input(s): GLUCAP in the last 168 hours. Lipid Profile: No results for input(s): CHOL, HDL, LDLCALC, TRIG, CHOLHDL, LDLDIRECT in the last 72  hours. Thyroid Function Tests: Recent Labs    06/01/20 0545  TSH 2.299   Anemia Panel: Recent Labs    06/01/20 0545  VITAMINB12 2,956*   Sepsis Labs: No results for input(s): PROCALCITON, LATICACIDVEN in the last 168 hours.  Recent Results (from the past 240 hour(s))  Urine culture     Status: None (Preliminary result)   Collection Time: 05/31/20  9:05 PM   Specimen: Urine, Random  Result Value Ref Range Status   Specimen Description   Final    URINE, RANDOM Performed at [facility], [address], [place], [place]  [id number]    Special Requests   Final    NONE Performed at [facility], [address] [place], [place] [address]    Culture   Final    CULTURE REINCUBATED FOR BETTER GROWTH Performed at [facility], [address] [place], [place] [address]    Report Status PENDING  Incomplete  SARS Coronavirus 2 by RT PCR (hospital order, performed in [facility] hospital lab) Nasopharyngeal Nasopharyngeal Swab     Status: None   Collection Time: 06/01/20  2:37 AM   Specimen: Nasopharyngeal Swab  Result Value Ref Range Status   SARS Coronavirus 2 NEGATIVE NEGATIVE Final    Comment: (NOTE) SARS-CoV-2 target nucleic acids are NOT DETECTED.  The SARS-CoV-2 RNA is generally detectable in upper and lower respiratory specimens during the acute phase of infection. The lowest concentration of SARS-CoV-2 viral copies this assay can detect is 250 copies / mL. A negative result does not preclude SARS-CoV-2 infection and should not be used as the sole basis for treatment or other patient management decisions.  A negative result may occur with improper specimen collection / handling, submission of specimen other than nasopharyngeal swab, presence of viral mutation(s) within the areas targeted by this assay, and inadequate number of viral copies (<250 copies / mL). A negative result must be combined with clinical observations, patient history, and  epidemiological information.  Fact Sheet for Patients:   BoilerBrush.com.cyhttps://www.fda.gov/media/136312/download  Fact Sheet for Healthcare Providers: https://pope.com/https://www.fda.gov/media/136313/download  This test is not yet approved or  cleared by the [place] FDA and has been authorized for detection and/or diagnosis of SARS-CoV-2 by FDA under an Emergency Use Authorization (EUA).  This EUA will remain in effect (meaning this test can be used) for the duration of the COVID-19 declaration under Section 564(b)(1) of the Act, 21 U.S.C. section 360bbb-3(b)(1), unless the authorization is terminated or revoked sooner.  Performed at [facility], [address] [place], [place] [address]          Radiology Studies: DG Chest 2 View  Result Date: 06/01/2020 CLINICAL DATA:  Cough, volume overload EXAM: CHEST - 2 VIEW COMPARISON:  None. FINDINGS: There is mild cardiomegaly. Aortic knob calcifications are seen. Diffusely increased interstitial markings are seen throughout both lungs. There are small bilateral pleural effusions. No acute osseous abnormality. IMPRESSION: Cardiomegaly and interstitial edema. Small bilateral pleural effusions. Electronically Signed   By: Jonna ClarkBindu  Avutu M.D.   On: 06/01/2020 01:13   ECHOCARDIOGRAM COMPLETE  Result Date: 06/01/2020    ECHOCARDIOGRAM REPORT   Patient Name:   Jordan ReddishJOHNNIE Brannigan Date of Exam: 06/01/2020 Medical Rec #:  [id number]      Height:       66.0 in Accession #:    [id number]     Weight:       150.0 lb Date of Birth:  11/08/1924       BSA:          1.770 m Patient Age:    89 years       BP:           126/78 mmHg Patient Gender: F              HR:           74 bpm. Exam Location:  [facility] Procedure: 2D Echo Indications:     Dyspnea 786.09/R06.00  History:         Patient has no prior history of Echocardiogram examinations.  CHF. CKD.  Sonographer:     Johnathan Hausen Referring Phys:  [id number] TIMOTHY S OPYD Diagnosing Phys: Olga Millers MD  IMPRESSIONS  1. Akinesis of the anteroseptal wall, apex and distal inferior wall; overall moderate to severe LV dysfunction; mild AI and MR; biatrial enlargement; mild TR with moderate pulmonary hypertension.  2. Left ventricular ejection fraction, by estimation, is 30 to 35%. The left ventricle has moderate to severely decreased function. The left ventricle demonstrates regional wall motion abnormalities (see scoring diagram/findings for description). Left ventricular diastolic parameters are indeterminate.  3. Right ventricular systolic function is normal. The right ventricular size is normal. There is moderately elevated pulmonary artery systolic pressure.  4. Left atrial size was moderately dilated.  5. Right atrial size was severely dilated.  6. The mitral valve is normal in structure. Mild mitral valve regurgitation. No evidence of mitral stenosis.  7. The aortic valve is normal in structure. Aortic valve regurgitation is mild. No aortic stenosis is present.  8. The inferior vena cava is dilated in size with >50% respiratory variability, suggesting right atrial pressure of 8 mmHg. FINDINGS  Left Ventricle: Left ventricular ejection fraction, by estimation, is 30 to 35%. The left ventricle has moderate to severely decreased function. The left ventricle demonstrates regional wall motion abnormalities. The left ventricular internal cavity size was normal in size. There is no left ventricular hypertrophy. Left ventricular diastolic parameters are indeterminate. Right Ventricle: The right ventricular size is normal.Right ventricular systolic function is normal. There is moderately elevated pulmonary artery systolic pressure. The tricuspid regurgitant velocity is 3.22 m/s, and with an assumed right atrial pressure of 10 mmHg, the estimated right ventricular systolic pressure is 51.5 mmHg. Left Atrium: Left atrial size was moderately dilated. Right Atrium: Right atrial size was severely dilated. Pericardium: Trivial  pericardial effusion is present. Mitral Valve: The mitral valve is normal in structure. Normal mobility of the mitral valve leaflets. Mild mitral valve regurgitation. No evidence of mitral valve stenosis. Tricuspid Valve: The tricuspid valve is normal in structure. Tricuspid valve regurgitation is mild . No evidence of tricuspid stenosis. Aortic Valve: The aortic valve is normal in structure. Aortic valve regurgitation is mild. No aortic stenosis is present. Aortic valve mean gradient measures 2.0 mmHg. Aortic valve peak gradient measures 3.4 mmHg. Aortic valve area, by VTI measures 1.78 cm. Pulmonic Valve: The pulmonic valve was normal in structure. Pulmonic valve regurgitation is trivial. No evidence of pulmonic stenosis. Aorta: The aortic root is normal in size and structure. Venous: The inferior vena cava is dilated in size with greater than 50% respiratory variability, suggesting right atrial pressure of 8 mmHg. IAS/Shunts: No atrial level shunt detected by color flow Doppler. Additional Comments: Akinesis of the anteroseptal wall, apex and distal inferior wall; overall moderate to severe LV dysfunction; mild AI and MR; biatrial enlargement; mild TR with moderate pulmonary hypertension.  LEFT VENTRICLE PLAX 2D LVIDd:         4.34 cm LVIDs:         3.66 cm LV PW:         1.11 cm LV IVS:        1.05 cm LVOT diam:     1.70 cm LV SV:         31 LV SV Index:   18 LVOT Area:     2.27 cm  RIGHT VENTRICLE             IVC RV S prime:     11.50 cm/s  IVC diam: 2.48 cm RVOT diam:      3.90 cm TAPSE (M-mode): 2.0 cm LEFT ATRIUM             Index       RIGHT ATRIUM           Index LA Vol (A2C):   61.0 ml 34.47 ml/m RA Area:     29.90 cm LA Vol (A4C):   65.9 ml 37.24 ml/m RA Volume:   111.00 ml 62.72 ml/m LA Biplane Vol: 64.1 ml 36.22 ml/m  AORTIC VALVE AV Area (Vmax):    1.94 cm AV Area (Vmean):   1.92 cm AV Area (VTI):     1.78 cm AV Vmax:           91.60 cm/s AV Vmean:          65.800 cm/s AV VTI:             0.176 m AV Peak Grad:      3.4 mmHg AV Mean Grad:      2.0 mmHg LVOT Vmax:         78.30 cm/s LVOT Vmean:        55.800 cm/s LVOT VTI:          0.138 m LVOT/AV VTI ratio: 0.78  AORTA Ao Root diam: 3.40 cm TRICUSPID VALVE TR Peak grad:   41.5 mmHg TR Vmax:        322.00 cm/s  SHUNTS Systemic VTI:  0.14 m Systemic Diam: 1.70 cm Pulmonic Diam: 3.90 cm Olga Millers MD Electronically signed by Olga Millers MD Signature Date/Time: 06/01/2020/11:50:59 AM    Final         Scheduled Meds: . aspirin EC  81 mg Oral Daily  . brimonidine  1 drop Both Eyes BID   And  . timolol  1 drop Both Eyes BID  . enoxaparin (LOVENOX) injection  40 mg Subcutaneous Q24H  . furosemide  40 mg Oral BID  . levothyroxine  50 mcg Oral q morning - 10a  . QUEtiapine  25 mg Oral QHS  . sodium chloride flush  3 mL Intravenous Once  . sodium chloride flush  3 mL Intravenous Q12H   Continuous Infusions: . sodium chloride       LOS: 1 day     Alwyn Ren, MD  06/02/2020, 11:03 AM

## 2020-06-03 LAB — BASIC METABOLIC PANEL
Anion gap: 7 (ref 5–15)
BUN: 25 mg/dL — ABNORMAL HIGH (ref 8–23)
CO2: 30 mmol/L (ref 22–32)
Calcium: 8.4 mg/dL — ABNORMAL LOW (ref 8.9–10.3)
Chloride: 98 mmol/L (ref 98–111)
Creatinine, Ser: 1.25 mg/dL — ABNORMAL HIGH (ref 0.44–1.00)
GFR calc Af Amer: 43 mL/min — ABNORMAL LOW (ref >60)
GFR calc non Af Amer: 37 mL/min — ABNORMAL LOW (ref >60)
Glucose, Bld: 104 mg/dL — ABNORMAL HIGH (ref 70–99)
Potassium: 3.2 mmol/L — ABNORMAL LOW (ref 3.5–5.1)
Sodium: 135 mmol/L (ref 135–145)

## 2020-06-03 MED ORDER — POTASSIUM CHLORIDE CRYS ER 20 MEQ PO TBCR
30.0000 meq | EXTENDED_RELEASE_TABLET | ORAL | Status: AC
  Administered 2020-06-03 (×2): 30 meq via ORAL
  Filled 2020-06-03 (×2): qty 1

## 2020-06-03 MED ORDER — ENOXAPARIN SODIUM 30 MG/0.3ML ~~LOC~~ SOLN
30.0000 mg | SUBCUTANEOUS | Status: DC
  Administered 2020-06-04: 11:00:00 30 mg via SUBCUTANEOUS
  Filled 2020-06-03: qty 0.3

## 2020-06-03 MED ORDER — QUETIAPINE FUMARATE 25 MG PO TABS
12.5000 mg | ORAL_TABLET | Freq: Every day | ORAL | Status: DC
  Administered 2020-06-03: 22:00:00 12.5 mg via ORAL
  Filled 2020-06-03: qty 1

## 2020-06-03 NOTE — Progress Notes (Signed)
PHARMACIST - PHYSICIAN COMMUNICATION  CONCERNING:  Enoxaparin (Lovenox) for DVT Prophylaxis   RECOMMENDATION: Patient was prescribed enoxaprin 40mg  q24 hours for VTE prophylaxis.   Filed Weights   05/31/20 1818  Weight: 68 kg (150 lb)    Body mass index is 24.21 kg/m.  Estimated Creatinine Clearance: 26.3 mL/min (A) (by C-G formula based on SCr of 1.25 mg/dL (H)).  Patient is candidate for enoxaparin 30mg  every 24 hours based on CrCl <73ml/min  DESCRIPTION: Pharmacy has adjusted enoxaparin dose per [facility]/[place] policy.  Patient is now receiving enoxaparin 30mg  every 24 hours.  , PharmD, BCPS Clinical Pharmacist 06/03/2020 8:57 AM

## 2020-06-03 NOTE — TOC Initial Note (Signed)
Transition of Care [facility]) - Initial/Assessment Note    Patient Details  Name: Jordan Howard MRN: [id number] Date of Birth: Jul 26, 1925  Transition of Care [facility]) CM/SW Contact:    Allayne Butcher, RN Phone Number: 06/03/2020, 3:57 PM  Clinical Narrative:                 Patient is from [facility].  Daughter, Sedalia Muta is in the room with patient and reports that she has been living there for 15 years or more.  The goal is for the patient to discharge back to Independent Living with home health services.  The patient and the daughter would like for her to stay in independent living as long as she is able.  Patient has a history of dementia, she gets her meals from the facility, daughter is out frequently checking in on her.  Patient admitted with new congestive heart failure.  Home Health nursing will be arranged at discharge to help with new diagnosis of CHF. Daughter given a list of Medicare approved home health agencies, [facility] team will check in tomorrow with daughter for her choice of agency.  This RNCM also provided patient's daughter with a list of family care homes and assisted living facilities in [place] and [facility] if patient requires more assistance that what can be provided at Independent Living facility in the future.    Expected Discharge Plan: Home w Home Health Services Barriers to Discharge: Continued Medical Work up   Patient Goals and CMS Choice Patient states their goals for this hospitalization and ongoing recovery are:: To return to [facility] independent living facility. CMS Medicare.gov Compare Post Acute Care list provided to:: Patient Represenative (must comment) Choice offered to / list presented to : Adult Children (Diane- daughter)  Expected Discharge Plan and Services Expected Discharge Plan: Home w Home Health Services   Discharge Planning Services: CM Consult Post Acute Care Choice: Home Health Living arrangements for the past 2 months:  Independent Living Facility                                      Prior Living Arrangements/Services Living arrangements for the past 2 months: Independent Living Facility Lives with:: Facility Resident Patient language and need for interpreter reviewed:: Yes Do you feel safe going back to the place where you live?: Yes      Need for Family Participation in Patient Care: Yes (Comment) (dementia) Care giver support system in place?: Yes (comment) (daughter) Current home services: DME (walker) Criminal Activity/Legal Involvement Pertinent to Current Situation/Hospitalization: No - Comment as needed  Activities of Daily Living Home Assistive Devices/Equipment: Other (Comment) (UTA) ADL Screening (condition at time of admission) Patient's cognitive ability adequate to safely complete daily activities?: No Is the patient deaf or have difficulty hearing?: No Does the patient have difficulty seeing, even when wearing glasses/contacts?: Yes Does the patient have difficulty concentrating, remembering, or making decisions?: Yes Patient able to express need for assistance with ADLs?: Yes Does the patient have difficulty dressing or bathing?: Yes Independently performs ADLs?: No Communication: Needs assistance Does the patient have difficulty walking or climbing stairs?: Yes Weakness of Legs: None Weakness of Arms/Hands: Both  Permission Sought/Granted Permission sought to share information with : Case Manager, Magazine features editor, Family Supports Permission granted to share information with : Yes, Verbal Permission Granted  Share Information with NAME: Diane  Permission granted to  share info w AGENCY: Home Health agency of choice  Permission granted to share info w Relationship: daughter     Emotional Assessment Appearance:: Appears younger than stated age Attitude/Demeanor/Rapport: Engaged Affect (typically observed): Accepting Orientation: : Oriented to Self,  Oriented to Place Alcohol / Substance Use: Not Applicable Psych Involvement: No (comment)  Admission diagnosis:  Acute CHF (congestive heart failure) (HCC) [I50.9] Acute respiratory failure with hypoxia (HCC) [J96.01] New onset of congestive heart failure (HCC) [I50.9] Patient Active Problem List   Diagnosis Date Noted  . New onset of congestive heart failure (HCC) 06/01/2020  . Dementia (HCC)   . Thyroid disease   . Chronic kidney disease, stage 3a    PCP:  Patrice Paradise, MD Pharmacy:   [facility] [address], [place] - [address] [place] [address] Phone: [phone] Fax: [phone]     Social Determinants of Health (SDOH) Interventions    Readmission Risk Interventions No flowsheet data found.

## 2020-06-03 NOTE — Progress Notes (Signed)
PROGRESS NOTE    Nakari Bracknell  OEV:[id number] DOB: December 22, 1924 DOA: 06/01/2020 PCP: Patrice Paradise, MD    Brief Narrative: Jordan Howard is a 86 y.o. female with medical history significant for dementia, hypothyroidism, and arthritis, now presenting to the emergency department from her independent living facility with increased confusion, bilateral leg swelling, and shortness of breath.  She is accompanied by her daughter who assists with the history.  Patient has been more confused than usual for the past few weeks, reports that she has been unable to interact with her friends at the ILF due to Covid restrictions, her daughter worries that she has not been taking her medications regularly, and notes that the patient began to complain of bilateral leg swelling approximately a week ago.  Patient also reported some shortness of breath recently.  There has not been any documented fevers and the patient denies chills.  Patient denies any chest pain at this time but does not remember if she has had any recently.  She denies abdominal pain, back pain, or dysuria.  ED Course: Upon arrival to the ED, patient is found to be afebrile, desaturating to the mid 80s with minimal exertion, and with stable blood pressure.  EKG features sinus rhythm with PACs.  Chest x-ray is concerning for cardiomegaly with interstitial edema and small pleural effusions.  Chemistry panel is notable for creatinine 1.02, similar to priors.  CBC is unremarkable.  BNP is elevated to 273.  Patient was given 20 mg IV Lasix in the ED.  COVID-19 screening test not yet resulted.  Assessment & Plan:   Principal Problem:   New onset of congestive heart failure (HCC) Active Problems:   Dementia (HCC)   Thyroid disease   Chronic kidney disease, stage 3a   #1 Acute systolic CHF Failure-new onset. Continue diuretics at a lower dose, patient with soft blood pressure.  I still do not see I's and O's or weight checked.  It has been  reordered again today.  Continue Lasix 20 mg daily  PT consult  Pending Echo-left ventricular ejection fraction 30 to 35%.  Akinesis of the anteroseptal wall apex and distal inferior wall overall moderate severe LV dysfunction.  Moderate pulmonary hypertension.     #2 history of dementia with increasing behaviors has been on Haldol which has not helped per nursing staff.  Start her on Seroquel 12.5 nightly.  #3 CKD stage IIIa monitor closely on diuretics and ACE.creatinine 1.25 from 1.02.  Will DC ACE inhibitor and continue low-dose Lasix.   #4 hypothyroidism her TSH is 2.2 continue current dose of Synthroid.  #5 glaucoma continue Alphagan and Timoptic drops    Estimated body mass index is 24.21 kg/m as calculated from the following:   Height as of this encounter: 5\' 6"  (1.676 m).   Weight as of this encounter: 68 kg.  DVT prophylaxis: Lovenox Code Status: Full code Family Communication: Discussed with daughter Diane in detail  Disposition Plan:  Status is: Inpatient  Dispo: The patient is from: Memory care              Anticipated d/c is to: Unknown              Anticipated d/c date is: 1 days              Patient currently is not medically stable to d/c.  Admitted with acute systolic CHF exacerbation new onset   Consultants:   None  Procedures: None Antimicrobials: None  Subjective: Patient sitting  up eating breakfast  Since starting Seroquel decreased behaviors noted  Objective: Vitals:   06/02/20 1732 06/02/20 2033 06/03/20 0815 06/03/20 1219  BP: (!) 86/70 (!) 139/95 100/67 101/65  Pulse: 66 96 99 (!) 103  Resp: 16 16 18 18   Temp: (!) 97.4 F (36.3 C) 97.8 F (36.6 C) 98.1 F (36.7 C) 97.9 F (36.6 C)  TempSrc: Oral Oral Oral Oral  SpO2: 98% 97% 99% 97%  Weight:      Height:        Intake/Output Summary (Last 24 hours) at 06/03/2020 1502 Last data filed at 06/03/2020 1000 Gross per 24 hour  Intake 240 ml  Output --  Net 240 ml   Filed Weights    05/31/20 1818  Weight: 68 kg    Examination:  General exam: Appears calm and comfortable  Respiratory system: Crackles b/l to auscultation. Respiratory effort normal. Cardiovascular system: S1 & S2 heard, RRR. No JVD, murmurs, rubs, gallops or clicks. No pedal edema. Gastrointestinal system: Abdomen is nondistended, soft and nontender. No organomegaly or masses felt. Normal bowel sounds heard. Central nervous system: Alert and oriented. No focal neurological deficits. Extremities: 1+ bilateral pitting edema Skin: No rashes, lesions or ulcers Psychiatry: Judgement and insight appear normal. Mood & affect appropriate.   Data Reviewed: I have personally reviewed following labs and imaging studies  CBC: Recent Labs  Lab 05/31/20 1828  WBC 5.4  HGB 12.4  HCT 37.7  MCV 95.9  PLT 225   Basic Metabolic Panel: Recent Labs  Lab 05/31/20 1828 06/01/20 0545 06/02/20 0700 06/03/20 0446  NA 133*  --  135 135  K 4.1  --  3.5 3.2*  CL 99  --  99 98  CO2 23  --  24 30  GLUCOSE 128*  --  121* 104*  BUN 16  --  19 25*  CREATININE 1.02*  --  1.15* 1.25*  CALCIUM 9.2  --  8.7* 8.4*  MG  --  2.1  --   --    GFR: Estimated Creatinine Clearance: 26.3 mL/min (A) (by C-G formula based on SCr of 1.25 mg/dL (H)). Liver Function Tests: Recent Labs  Lab 05/31/20 1828 06/01/20 0545  AST 44* 47*  ALT 33 33  ALKPHOS 64 67  BILITOT 1.5* 2.2*  PROT 7.1 7.2  ALBUMIN 4.3 4.5   No results for input(s): LIPASE, AMYLASE in the last 168 hours. Recent Labs  Lab 06/01/20 0545  AMMONIA 11   Coagulation Profile: No results for input(s): INR, PROTIME in the last 168 hours. Cardiac Enzymes: No results for input(s): CKTOTAL, CKMB, CKMBINDEX, TROPONINI in the last 168 hours. BNP (last 3 results) No results for input(s): PROBNP in the last 8760 hours. HbA1C: No results for input(s): HGBA1C in the last 72 hours. CBG: No results for input(s): GLUCAP in the last 168 hours. Lipid Profile: No  results for input(s): CHOL, HDL, LDLCALC, TRIG, CHOLHDL, LDLDIRECT in the last 72 hours. Thyroid Function Tests: Recent Labs    06/01/20 0545  TSH 2.299   Anemia Panel: Recent Labs    06/01/20 0545  VITAMINB12 2,956*   Sepsis Labs: No results for input(s): PROCALCITON, LATICACIDVEN in the last 168 hours.  Recent Results (from the past 240 hour(s))  Urine culture     Status: Abnormal   Collection Time: 05/31/20  9:05 PM   Specimen: Urine, Random  Result Value Ref Range Status   Specimen Description   Final    URINE, RANDOM Performed at [facility], [address], [place], [place] [address]    Special Requests   Final    NONE Performed at [facility], [address] [place], [place] [address]    Culture MULTIPLE SPECIES PRESENT, SUGGEST RECOLLECTION (A)  Final   Report Status 06/02/2020 FINAL  Final  SARS Coronavirus 2 by RT PCR (hospital order, performed in [facility] hospital lab) Nasopharyngeal Nasopharyngeal Swab     Status: None   Collection Time: 06/01/20  2:37 AM   Specimen: Nasopharyngeal Swab  Result Value Ref Range Status   SARS Coronavirus 2 NEGATIVE NEGATIVE Final    Comment: (NOTE) SARS-CoV-2 target nucleic acids are NOT DETECTED.  The SARS-CoV-2 RNA is generally detectable in upper and lower respiratory specimens during the acute phase of infection. The lowest concentration of SARS-CoV-2 viral copies this assay can detect is 250 copies / mL. A negative result does not preclude SARS-CoV-2 infection and should not be used as the sole basis for treatment or other patient management decisions.  A negative result may occur with improper specimen collection / handling, submission of specimen other than nasopharyngeal swab, presence of viral mutation(s) within the areas targeted by this assay, and inadequate number of viral copies (<250 copies / mL). A negative result must be combined with clinical observations, patient history, and  epidemiological information.  Fact Sheet for Patients:   BoilerBrush.com.cy  Fact Sheet for Healthcare Providers: https://pope.com/  This test is not yet approved or  cleared by the [place] FDA and has been authorized for detection and/or diagnosis of SARS-CoV-2 by FDA under an Emergency Use Authorization (EUA).  This EUA will remain in effect (meaning this test can be used) for the duration of the COVID-19 declaration under Section 564(b)(1) of the Act, 21 U.S.C. section 360bbb-3(b)(1), unless the authorization is terminated or revoked sooner.  Performed at [facility], [address] [place], [place] [address]          Radiology Studies: DG Chest 1 View  Result Date: 06/02/2020 CLINICAL DATA:  86 year old with increased confusion and bilateral leg swelling. Shortness of breath. EXAM: CHEST  1 VIEW COMPARISON:  Radiograph yesterday. FINDINGS: Stable cardiomegaly. Improved pulmonary edema. Small bilateral pleural effusions, increased on the left from prior exam. No confluent airspace disease. No pneumothorax. Stable osseous structures. IMPRESSION: CHF. Improved pulmonary edema but slight increase in left pleural effusion since yesterday. Electronically Signed   By: Narda Rutherford M.D.   On: 06/02/2020 16:43        Scheduled Meds: . aspirin EC  81 mg Oral Daily  . brimonidine  1 drop Both Eyes BID   And  . timolol  1 drop Both Eyes BID  . [START ON 06/04/2020] enoxaparin (LOVENOX) injection  30 mg Subcutaneous Q24H  . furosemide  20 mg Oral Daily  . levothyroxine  50 mcg Oral q morning - 10a  . nystatin   Topical BID  . potassium chloride  30 mEq Oral Q4H  . QUEtiapine  25 mg Oral QHS  . sodium chloride flush  3 mL Intravenous Once  . sodium chloride flush  3 mL Intravenous Q12H   Continuous Infusions: . sodium chloride       LOS: 2 days     Alwyn Ren, MD  06/03/2020, 3:02 PM

## 2020-06-04 LAB — BASIC METABOLIC PANEL
Anion gap: 13 (ref 5–15)
BUN: 24 mg/dL — ABNORMAL HIGH (ref 8–23)
CO2: 23 mmol/L (ref 22–32)
Calcium: 9 mg/dL (ref 8.9–10.3)
Chloride: 101 mmol/L (ref 98–111)
Creatinine, Ser: 1.04 mg/dL — ABNORMAL HIGH (ref 0.44–1.00)
GFR calc Af Amer: 54 mL/min — ABNORMAL LOW (ref >60)
GFR calc non Af Amer: 46 mL/min — ABNORMAL LOW (ref >60)
Glucose, Bld: 113 mg/dL — ABNORMAL HIGH (ref 70–99)
Potassium: 3.9 mmol/L (ref 3.5–5.1)
Sodium: 137 mmol/L (ref 135–145)

## 2020-06-04 MED ORDER — HALOPERIDOL LACTATE 5 MG/ML IJ SOLN
2.5000 mg | Freq: Once | INTRAMUSCULAR | Status: AC
  Administered 2020-06-04: 2.5 mg via INTRAVENOUS
  Filled 2020-06-04: qty 1

## 2020-06-04 MED ORDER — POTASSIUM CHLORIDE ER 10 MEQ PO TBCR: 10.0000 meq | EXTENDED_RELEASE_TABLET | Freq: Every day | ORAL | 2 refills | Status: AC

## 2020-06-04 MED ORDER — POLYETHYLENE GLYCOL 3350 17 G PO PACK
17.0000 g | PACK | Freq: Two times a day (BID) | ORAL | Status: DC
  Administered 2020-06-04: 11:00:00 17 g via ORAL
  Filled 2020-06-04: qty 1

## 2020-06-04 MED ORDER — POLYETHYLENE GLYCOL 3350 17 G PO PACK: 17.0000 g | PACK | Freq: Two times a day (BID) | ORAL | 0 refills | Status: AC

## 2020-06-04 MED ORDER — QUETIAPINE FUMARATE 25 MG PO TABS: 12.5000 mg | ORAL_TABLET | Freq: Every day | ORAL | 0 refills | Status: DC

## 2020-06-04 MED ORDER — FUROSEMIDE 20 MG PO TABS: 20.0000 mg | ORAL_TABLET | ORAL | 1 refills | Status: AC

## 2020-06-04 MED ORDER — ENOXAPARIN SODIUM 40 MG/0.4ML ~~LOC~~ SOLN: 40.0000 mg | SUBCUTANEOUS | Status: DC

## 2020-06-04 NOTE — Care Management Important Message (Signed)
Important Message  Patient Details  Name: Jordan Howard MRN: [id number] Date of Birth: 1925/09/29   Medicare Important Message Given:  Yes     [name] 06/04/2020, 12:49 PM

## 2020-06-04 NOTE — Evaluation (Signed)
Physical Therapy Evaluation Patient Details Name: Jordan Howard MRN: [id number] DOB: August 20, 1925 Today's Date: 06/04/2020   History of Present Illness  86 y.o. female with medical history significant for dementia, hypothyroidism, and arthritis, now presenting from her independent living facility with increased confusion, bilateral leg swelling, and shortness of breath.  Clinical Impression  Pt able to ambulation around the nurses station with only light CGA and cuing, however her HR reached ~160 with the effort and though she did not endorse excessive fatigue she clearly was tired with the effort.  Nursing notified with vitals changes.  Pt confused but able to follow simple instructions, likely close to her baseline.  Pt is apparently in the independent living facility portion of [facility], per today's mental status impression she may be more appropriate in a memory care setting per available guidance/help available at current setting.     Follow Up Recommendations Home health PT;Supervision/Assistance - 24 hour    Equipment Recommendations  None recommended by PT    Recommendations for Other Services       Precautions / Restrictions Precautions Precautions: Fall Restrictions Weight Bearing Restrictions: No      Mobility  Bed Mobility               General bed mobility comments: in recliner on arrival, not tested  Transfers Overall transfer level: Modified independent Equipment used: None             General transfer comment: Pt able to stand with good confidence, no LOBs but did lack safety awareness.  Ambulation/Gait Ambulation/Gait assistance: Supervision Gait Distance (Feet): 200 Feet Assistive device: None       General Gait Details: Pt able to circumambulate the nurses' station w/o AD, and though she did not have any overt LOBs showed some general unsteadiness and frequent reaching toward (but rarely touching) surfaces.  Biggest issue appeared to be HR, at  beginning of ambulation she was in the 110s and is gradually increased with increased distance - eventually reaching ~160.  Pt did not endorse excessive fatigue, though she was tired with the effort.  Nursing notified, apparently tele monitor (amongst most other accoutrements) was pulled off by this confused patient  Information systems manager [place] (Stroke Patients Only)       Balance Overall balance assessment: Needs assistance   Sitting balance-Leahy Scale: Good     Standing balance support: No upper extremity supported Standing balance-Leahy Scale: Fair Standing balance comment: Pt with no overt LOBs, but did have general unsteadiness t/o the ambulation effort                             Pertinent Vitals/Pain Pain Assessment: No/denies pain    Home Living Family/patient expects to be discharged to::  (independent living) Living Arrangements: Alone   Type of Home: Independent living facility Home Access: Level entry              Prior Function Level of Independence: Needs assistance   Gait / Transfers Assistance Needed: Pt reports she does a lot of walking at her ILF w/o AD  ADL's / Homemaking Assistance Needed: facility provides meals, unsure of how she manages her meds, etc        Hand Dominance        Extremity/Trunk Assessment   Upper Extremity Assessment Upper Extremity Assessment: Generalized weakness;Overall [facility] for  tasks assessed    Lower Extremity Assessment Lower Extremity Assessment: Generalized weakness;Overall WFL for tasks assessed       Communication   Communication: No difficulties  Cognition Arousal/Alertness: Awake/alert Behavior During Therapy: Impulsive;Restless Overall Cognitive Status: History of cognitive impairments - at baseline                                 General Comments: unsure of baseline, but appears she is likely near baseline      General Comments  General comments (skin integrity, edema, etc.): Pt pleasantly confused t/o session, did need some extra cuing to stay on task.  biggest issue was HR increase with activity (160 bpm with prolonged ambulation)    Exercises     Assessment/Plan    PT Assessment Patient needs continued PT services  PT Problem List Decreased strength;Decreased range of motion;Decreased activity tolerance;Decreased balance;Decreased mobility;Decreased coordination;Decreased cognition;Decreased safety awareness;Cardiopulmonary status limiting activity       PT Treatment Interventions DME instruction;Gait training;Functional mobility training;Therapeutic activities;Therapeutic exercise;Balance training;Neuromuscular re-education;Cognitive remediation;Patient/family education    PT Goals (Current goals can be found in the Care Plan section)  Acute Rehab PT Goals Patient Stated Goal: does not state PT Goal Formulation: Patient unable to participate in goal setting Time For Goal Achievement: 06/18/20 Potential to Achieve Goals: Good    Frequency Min 2X/week   Barriers to discharge        Co-evaluation               AM-PAC PT "6 Clicks" Mobility  Outcome Measure Help needed turning from your back to your side while in a flat bed without using bedrails?: None Help needed moving from lying on your back to sitting on the side of a flat bed without using bedrails?: None Help needed moving to and from a bed to a chair (including a wheelchair)?: None Help needed standing up from a chair using your arms (e.g., wheelchair or bedside chair)?: None Help needed to walk in hospital room?: A Little Help needed climbing 3-5 steps with a railing? : A Little 6 Click Score: 22    End of Session Equipment Utilized During Treatment: Gait belt Activity Tolerance: Patient tolerated treatment well Patient left: with chair alarm set;with call bell/phone within reach Nurse Communication: Mobility status (HR increase with  activity) PT Visit Diagnosis: Muscle weakness (generalized) (M62.81);Difficulty in walking, not elsewhere classified (R26.2);Unsteadiness on feet (R26.81)    Time: 0131-4388 PT Time Calculation (min) (ACUTE ONLY): 17 min   Charges:   PT Evaluation $PT Eval Low Complexity: 1 Low PT Treatments $Gait Training: 8-22 mins        Malachi Pro, DPT 06/04/2020, 10:35 AM

## 2020-06-04 NOTE — TOC Transition Note (Signed)
Transition of Care [facility]) - CM/SW Discharge Note   Patient Details  Name: Jordan Howard MRN: [id number] Date of Birth: 10-Feb-1925  [facility] CM/SW Contact:  Allayne Butcher, RN Phone Number: 06/04/2020, 1:07 PM   Clinical Narrative:    Patient has been medically cleared for discharge home today.  Patient will [facility] to [facility].  Daughter, Sedalia Muta chooses Forensic [facility] for home health services.  Becky Sax with [facility] will be reaching out to speak with the daughter this evening to set up first visit.  Patient's daughter will provide transportation at discharge.    Final next level of care: Home w Home Health Services Barriers to Discharge: Barriers Resolved   Patient Goals and CMS Choice Patient states their goals for this hospitalization and ongoing recovery are:: To return to [facility] independent living facility. CMS Medicare.gov Compare Post Acute Care list provided to:: Patient Represenative (must comment) Choice offered to / list presented to : Adult Children (daughter)  Discharge Placement                       Discharge Plan and Services   Discharge Planning Services: CM Consult Post Acute Care Choice: Home Health                    HH Arranged: RN, PT, OT, Nurse's Aide, Social Work [employer] Agency: [facility] Date [facility] Agency Contacted: 06/04/20 Time HH Agency Contacted: [phone] Representative spoke with at [facility] Agency: Becky Sax  Social Determinants of Health (SDOH) Interventions     Readmission Risk Interventions No flowsheet data found.

## 2020-06-04 NOTE — Discharge Summary (Addendum)
Physician Discharge Summary  Jordan ReddishJohnnie Howard ZOX:[id number] DOB: 10/11/1925 DOA: 06/01/2020  PCP: Patrice ParadiseMcLaughlin, Miriam K, MD  Admit date: 06/01/2020 Discharge date: 06/04/2020  Admitted From: Memory care Disposition: Memory care Recommendations for Outpatient Follow-up:  1. Follow up with PCP in 1-2 weeks 2. Please obtain BMP/CBC in one week 3. Please follow up with cardiologist Dr. Lady GaryFath  Home Health yes Equipment/Devices: None  Discharge Condition: Stable and improved CODE STATUS full code Diet recommendation: Cardiac diet Brief/Interim Summary:Jordan Busickis a 86 y.o.femalewith medical history significant fordementia, hypothyroidism, and arthritis, now presenting to the emergency department from her independent living facility with increased confusion, bilateral leg swelling, and shortness of breath. She is accompanied by her daughter who assists with the history. Patient has been more confused than usual for the past few weeks, reports that she has been unable to interact with her friends at the ILF due to Covid restrictions, her daughter worries that she has not been taking her medications regularly, and notes that the patient began to complain of bilateral leg swelling approximately a week ago. Patient also reported some shortness of breath recently. There has not been any documented fevers and the patient denies chills. Patient denies any chest pain at this time but does not remember if she has had any recently. She denies abdominal pain, back pain, or dysuria.  ED Course:Upon arrival to the ED, patient is found to be afebrile, desaturating to the mid 80s with minimal exertion, and with stable blood pressure. EKG features sinus rhythm with PACs. Chest x-ray is concerning for cardiomegaly with interstitial edema and small pleural effusions. Chemistry panel is notable for creatinine 1.02, similar to priors. CBC is unremarkable. BNP is elevated to 273. Patient was given 20 mg IV  Lasix in the ED. COVID-19 screening test not yet resulted.  Discharge Diagnoses:  Principal Problem:   New onset of congestive heart failure (HCC) Active Problems:   Dementia (HCC)   Thyroid disease   Chronic kidney disease, stage 3a  #1 Acute systolic CHF Failure-new onset. Continue diuretics at a lower dose, patient with soft blood pressure. Continue Lasix 20 mg every other day.  I have not placed her on an ACE inhibitor due to low blood pressure. PT consult recommends home health physical therapy Echo-left ventricular ejection fraction 30 to 35%.  Akinesis of the anteroseptal wall apex and distal inferior wall overall moderate severe LV dysfunction.  Moderate pulmonary hypertension.     #2 history of dementia continue supportive treatment.  #3 CKD stage IIIa stable on diuretics.   #4 hypothyroidism her TSH is 2.2 continue current dose of Synthroid.  #5 glaucoma continue Alphagan and Timoptic drops    Estimated body mass index is 24.21 kg/m as calculated from the following:   Height as of this encounter: 5\' 6"  (1.676 m).   Weight as of this encounter: 68 kg.  Discharge Instructions  Discharge Instructions    Diet - low sodium heart healthy   Complete by: As directed    Diet Carb Modified   Complete by: As directed    Increase activity slowly   Complete by: As directed      Allergies as of 06/04/2020   No Known Allergies     Medication List    TAKE these medications   aspirin 81 MG EC tablet Take 81 mg by mouth daily.   Ca Phosphate-Cholecalciferol 250-400 MG-UNIT Chew Chew 1 tablet by mouth daily.   Cholecalciferol 25 MCG (1000 UT) tablet Take 1,000 Units by mouth  daily.   Combigan 0.2-0.5 % ophthalmic solution Generic drug: brimonidine-timolol Apply 1 drop to eye 2 (two) times daily.   cyanocobalamin 1000 MCG tablet Take 1,000 mcg by mouth daily.   furosemide 20 MG tablet Commonly known as: LASIX Take 1 tablet (20 mg total) by mouth every other  day.   levothyroxine 50 MCG tablet Commonly known as: SYNTHROID Take 50 mcg by mouth every morning.   polyethylene glycol 17 g packet Commonly known as: MIRALAX / GLYCOLAX Take 17 g by mouth 2 (two) times daily.   potassium chloride 10 MEQ tablet Commonly known as: KLOR-CON Take 1 tablet (10 mEq total) by mouth daily.   Premarin vaginal cream Generic drug: conjugated estrogens INSERT 1 GRAM VAGINALLY TWICE A WEEK   QUEtiapine 25 MG tablet Commonly known as: SEROQUEL Take 0.5 tablets (12.5 mg total) by mouth at bedtime.       Follow-up Information    [facility] Follow up on 06/20/2020.   Specialty: Cardiology Why: at 11:00am. Enter through the Medical Mall entrance Contact information: [address] [place] [address] [phone]       Patrice Paradise, MD Follow up.   Specialty: Physician Assistant Contact information: [address] [facility] [place] [address] [phone]              No Known Allergies  Consultations:  None   Procedures/Studies: DG Chest 1 View  Result Date: 06/02/2020 CLINICAL DATA:  86 year old with increased confusion and bilateral leg swelling. Shortness of breath. EXAM: CHEST  1 VIEW COMPARISON:  Radiograph yesterday. FINDINGS: Stable cardiomegaly. Improved pulmonary edema. Small bilateral pleural effusions, increased on the left from prior exam. No confluent airspace disease. No pneumothorax. Stable osseous structures. IMPRESSION: CHF. Improved pulmonary edema but slight increase in left pleural effusion since yesterday. Electronically Signed   By: Narda Rutherford M.D.   On: 06/02/2020 16:43   DG Chest 2 View  Result Date: 06/01/2020 CLINICAL DATA:  Cough, volume overload EXAM: CHEST - 2 VIEW COMPARISON:  None. FINDINGS: There is mild cardiomegaly. Aortic knob calcifications are seen. Diffusely increased interstitial markings are  seen throughout both lungs. There are small bilateral pleural effusions. No acute osseous abnormality. IMPRESSION: Cardiomegaly and interstitial edema. Small bilateral pleural effusions. Electronically Signed   By: Jonna Clark M.D.   On: 06/01/2020 01:13   ECHOCARDIOGRAM COMPLETE  Result Date: 06/01/2020    ECHOCARDIOGRAM REPORT   Patient Name:   Jordan Howard Date of Exam: 06/01/2020 Medical Rec #:  [id number]      Height:       66.0 in Accession #:    [id number]     Weight:       150.0 lb Date of Birth:  Mar 13, 1925       BSA:          1.770 m Patient Age:    86 years       BP:           126/78 mmHg Patient Gender: F              HR:           74 bpm. Exam Location:  [facility] Procedure: 2D Echo Indications:     Dyspnea 786.09/R06.00  History:         Patient has no prior history of Echocardiogram examinations.  CHF. CKD.  Sonographer:     Johnathan Hausen Referring Phys:  [id number] TIMOTHY S OPYD Diagnosing Phys: Olga Millers MD IMPRESSIONS  1. Akinesis of the anteroseptal wall, apex and distal inferior wall; overall moderate to severe LV dysfunction; mild AI and MR; biatrial enlargement; mild TR with moderate pulmonary hypertension.  2. Left ventricular ejection fraction, by estimation, is 30 to 35%. The left ventricle has moderate to severely decreased function. The left ventricle demonstrates regional wall motion abnormalities (see scoring diagram/findings for description). Left ventricular diastolic parameters are indeterminate.  3. Right ventricular systolic function is normal. The right ventricular size is normal. There is moderately elevated pulmonary artery systolic pressure.  4. Left atrial size was moderately dilated.  5. Right atrial size was severely dilated.  6. The mitral valve is normal in structure. Mild mitral valve regurgitation. No evidence of mitral stenosis.  7. The aortic valve is normal in structure. Aortic valve regurgitation is mild. No aortic stenosis is present.  8. The  inferior vena cava is dilated in size with >50% respiratory variability, suggesting right atrial pressure of 8 mmHg. FINDINGS  Left Ventricle: Left ventricular ejection fraction, by estimation, is 30 to 35%. The left ventricle has moderate to severely decreased function. The left ventricle demonstrates regional wall motion abnormalities. The left ventricular internal cavity size was normal in size. There is no left ventricular hypertrophy. Left ventricular diastolic parameters are indeterminate. Right Ventricle: The right ventricular size is normal.Right ventricular systolic function is normal. There is moderately elevated pulmonary artery systolic pressure. The tricuspid regurgitant velocity is 3.22 m/s, and with an assumed right atrial pressure of 10 mmHg, the estimated right ventricular systolic pressure is 51.5 mmHg. Left Atrium: Left atrial size was moderately dilated. Right Atrium: Right atrial size was severely dilated. Pericardium: Trivial pericardial effusion is present. Mitral Valve: The mitral valve is normal in structure. Normal mobility of the mitral valve leaflets. Mild mitral valve regurgitation. No evidence of mitral valve stenosis. Tricuspid Valve: The tricuspid valve is normal in structure. Tricuspid valve regurgitation is mild . No evidence of tricuspid stenosis. Aortic Valve: The aortic valve is normal in structure. Aortic valve regurgitation is mild. No aortic stenosis is present. Aortic valve mean gradient measures 2.0 mmHg. Aortic valve peak gradient measures 3.4 mmHg. Aortic valve area, by VTI measures 1.78 cm. Pulmonic Valve: The pulmonic valve was normal in structure. Pulmonic valve regurgitation is trivial. No evidence of pulmonic stenosis. Aorta: The aortic root is normal in size and structure. Venous: The inferior vena cava is dilated in size with greater than 50% respiratory variability, suggesting right atrial pressure of 8 mmHg. IAS/Shunts: No atrial level shunt detected by color  flow Doppler. Additional Comments: Akinesis of the anteroseptal wall, apex and distal inferior wall; overall moderate to severe LV dysfunction; mild AI and MR; biatrial enlargement; mild TR with moderate pulmonary hypertension.  LEFT VENTRICLE PLAX 2D LVIDd:         4.34 cm LVIDs:         3.66 cm LV PW:         1.11 cm LV IVS:        1.05 cm LVOT diam:     1.70 cm LV SV:         31 LV SV Index:   18 LVOT Area:     2.27 cm  RIGHT VENTRICLE             IVC RV S prime:     11.50 cm/s  IVC diam: 2.48 cm RVOT diam:      3.90 cm TAPSE (M-mode): 2.0 cm LEFT ATRIUM             Index       RIGHT ATRIUM           Index LA Vol (A2C):   61.0 ml 34.47 ml/m RA Area:     29.90 cm LA Vol (A4C):   65.9 ml 37.24 ml/m RA Volume:   111.00 ml 62.72 ml/m LA Biplane Vol: 64.1 ml 36.22 ml/m  AORTIC VALVE AV Area (Vmax):    1.94 cm AV Area (Vmean):   1.92 cm AV Area (VTI):     1.78 cm AV Vmax:           91.60 cm/s AV Vmean:          65.800 cm/s AV VTI:            0.176 m AV Peak Grad:      3.4 mmHg AV Mean Grad:      2.0 mmHg LVOT Vmax:         78.30 cm/s LVOT Vmean:        55.800 cm/s LVOT VTI:          0.138 m LVOT/AV VTI ratio: 0.78  AORTA Ao Root diam: 3.40 cm TRICUSPID VALVE TR Peak grad:   41.5 mmHg TR Vmax:        322.00 cm/s  SHUNTS Systemic VTI:  0.14 m Systemic Diam: 1.70 cm Pulmonic Diam: 3.90 cm Olga Millers MD Electronically signed by Olga Millers MD Signature Date/Time: 06/01/2020/11:50:59 AM    Final     (Echo, Carotid, EGD, Colonoscopy, ERCP)    Subjective:  Patient sitting up in chair eating breakfast in no acute distress denies any new complaints no events overnight. Discharge Exam: Vitals:   06/04/20 1158 06/04/20 1211  BP: (!) 118/91 108/73  Pulse: 93 87  Resp:  15  Temp: (!) 97.3 F (36.3 C) (!) 97.4 F (36.3 C)  SpO2: 99% 99%   Vitals:   06/04/20 0418 06/04/20 0824 06/04/20 1158 06/04/20 1211  BP: (!) 127/100 (!) 116/99 (!) 118/91 108/73  Pulse: 87 96 93 87  Resp:  17  15  Temp:  98.7 F (37.1 C) 97.7 F (36.5 C) (!) 97.3 F (36.3 C) (!) 97.4 F (36.3 C)  TempSrc:  Oral Oral Oral  SpO2: 96% 96% 99% 99%  Weight:      Height:        General: Pt is alert, awake, not in acute distress Cardiovascular: RRR, S1/S2 +, no rubs, no gallops Respiratory: Few crackles at the bases bilaterally, no wheezing, no rhonchi Abdominal: Soft, NT, ND, bowel sounds + Extremities: no edema, no cyanosis    The results of significant diagnostics from this hospitalization (including imaging, microbiology, ancillary and laboratory) are listed below for reference.     Microbiology: Recent Results (from the past 240 hour(s))  Urine culture     Status: Abnormal   Collection Time: 05/31/20  9:05 PM   Specimen: Urine, Random  Result Value Ref Range Status   Specimen Description   Final    URINE, RANDOM Performed at [facility], [address], [place], [place] [address]    Special Requests   Final    NONE Performed at [facility], [address] [place], [place] [address]    Culture MULTIPLE SPECIES PRESENT, SUGGEST RECOLLECTION (A)  Final   Report Status 06/02/2020 FINAL  Final  SARS Coronavirus 2 by RT PCR (hospital order, performed in [facility] hospital lab) Nasopharyngeal Nasopharyngeal Swab     Status: None   Collection Time: 06/01/20  2:37 AM   Specimen: Nasopharyngeal Swab  Result Value Ref Range Status   SARS Coronavirus 2 NEGATIVE NEGATIVE Final    Comment: (NOTE) SARS-CoV-2 target nucleic acids are NOT DETECTED.  The SARS-CoV-2 RNA is generally detectable in upper and lower respiratory specimens during the acute phase of infection. The lowest concentration of SARS-CoV-2 viral copies this assay can detect is 250 copies / mL. A negative result does not preclude SARS-CoV-2 infection and should not be used as the sole basis for treatment or other patient management decisions.  A negative result may occur with improper specimen collection /  handling, submission of specimen other than nasopharyngeal swab, presence of viral mutation(s) within the areas targeted by this assay, and inadequate number of viral copies (<250 copies / mL). A negative result must be combined with clinical observations, patient history, and epidemiological information.  Fact Sheet for Patients:   BoilerBrush.com.cy  Fact Sheet for Healthcare Providers: https://pope.com/  This test is not yet approved or  cleared by the [place] FDA and has been authorized for detection and/or diagnosis of SARS-CoV-2 by FDA under an Emergency Use Authorization (EUA).  This EUA will remain in effect (meaning this test can be used) for the duration of the COVID-19 declaration under Section 564(b)(1) of the Act, 21 U.S.C. section 360bbb-3(b)(1), unless the authorization is terminated or revoked sooner.  Performed at [facility], [address] [place], [place] [address]      Labs: BNP (last 3 results) Recent Labs    05/31/20 1828  BNP 273.0*   Basic Metabolic Panel: Recent Labs  Lab 05/31/20 1828 06/01/20 0545 06/02/20 0700 06/03/20 0446 06/04/20 0524  NA 133*  --  135 135 137  K 4.1  --  3.5 3.2* 3.9  CL 99  --  99 98 101  CO2 23  --  GLUCOSE 128*  --  121* 104* 113*  BUN 16  --  19 25* 24*  CREATININE 1.02*  --  1.15* 1.25* 1.04*  CALCIUM 9.2  --  8.7* 8.4* 9.0  MG  --  2.1  --   --   --    Liver Function Tests: Recent Labs  Lab 05/31/20 1828 06/01/20 0545  AST 44* 47*  ALT 33 33  ALKPHOS 64 67  BILITOT 1.5* 2.2*  PROT 7.1 7.2  ALBUMIN 4.3 4.5   No results for input(s): LIPASE, AMYLASE in the last 168 hours. Recent Labs  Lab 06/01/20 0545  AMMONIA 11   CBC: Recent Labs  Lab 05/31/20 1828  WBC 5.4  HGB 12.4  HCT 37.7  MCV 95.9  PLT 225   Cardiac Enzymes: No results for input(s): CKTOTAL, CKMB, CKMBINDEX, TROPONINI in the last 168  hours. BNP: Invalid input(s): POCBNP CBG: No results for input(s): GLUCAP in the last 168 hours. D-Dimer No results for input(s): DDIMER in the last 72 hours. Hgb A1c No results for input(s): HGBA1C in the last 72 hours. Lipid Profile No results for input(s): CHOL, HDL, LDLCALC, TRIG, CHOLHDL, LDLDIRECT in the last 72 hours. Thyroid function studies No results for input(s): TSH, T4TOTAL, T3FREE, THYROIDAB in the last 72 hours.  Invalid input(s): FREET3 Anemia work up No results for input(s): VITAMINB12, FOLATE, FERRITIN, TIBC, IRON, RETICCTPCT in the last 72 hours. Urinalysis  Component Value Date/Time   COLORURINE YELLOW (A) 05/31/2020 1828   APPEARANCEUR HAZY (A) 05/31/2020 1828   LABSPEC 1.014 05/31/2020 1828   PHURINE 5.0 05/31/2020 1828   GLUCOSEU NEGATIVE 05/31/2020 1828   HGBUR SMALL (A) 05/31/2020 1828   BILIRUBINUR NEGATIVE 05/31/2020 1828   KETONESUR 5 (A) 05/31/2020 1828   PROTEINUR 30 (A) 05/31/2020 1828   NITRITE NEGATIVE 05/31/2020 1828   LEUKOCYTESUR NEGATIVE 05/31/2020 1828   Sepsis Labs Invalid input(s): PROCALCITONIN,  WBC,  LACTICIDVEN Microbiology Recent Results (from the past 240 hour(s))  Urine culture     Status: Abnormal   Collection Time: 05/31/20  9:05 PM   Specimen: Urine, Random  Result Value Ref Range Status   Specimen Description   Final    URINE, RANDOM Performed at [facility], [address] [place], [place] [address]    Special Requests   Final    NONE Performed at [facility], [address] [place], [place] [address]    Culture MULTIPLE SPECIES PRESENT, SUGGEST RECOLLECTION (A)  Final   Report Status 06/02/2020 FINAL  Final  SARS Coronavirus 2 by RT PCR (hospital order, performed in [facility] hospital lab) Nasopharyngeal Nasopharyngeal Swab     Status: None   Collection Time: 06/01/20  2:37 AM   Specimen: Nasopharyngeal Swab  Result Value Ref Range Status   SARS Coronavirus 2 NEGATIVE NEGATIVE Final     Comment: (NOTE) SARS-CoV-2 target nucleic acids are NOT DETECTED.  The SARS-CoV-2 RNA is generally detectable in upper and lower respiratory specimens during the acute phase of infection. The lowest concentration of SARS-CoV-2 viral copies this assay can detect is 250 copies / mL. A negative result does not preclude SARS-CoV-2 infection and should not be used as the sole basis for treatment or other patient management decisions.  A negative result may occur with improper specimen collection / handling, submission of specimen other than nasopharyngeal swab, presence of viral mutation(s) within the areas targeted by this assay, and inadequate number of viral copies (<250 copies / mL). A negative result must be combined with clinical observations, patient history, and epidemiological information.  Fact Sheet for Patients:   BoilerBrush.com.cy  Fact Sheet for Healthcare Providers: https://pope.com/  This test is not yet approved or  cleared by the [place] FDA and has been authorized for detection and/or diagnosis of SARS-CoV-2 by FDA under an Emergency Use Authorization (EUA).  This EUA will remain in effect (meaning this test can be used) for the duration of the COVID-19 declaration under Section 564(b)(1) of the Act, 21 U.S.C. section 360bbb-3(b)(1), unless the authorization is terminated or revoked sooner.  Performed at [facility], [address], [place], [place] [address]      Time coordinating discharge:  39 minutes  SIGNED:   Alwyn Ren, MD  [facility] 06/04/2020, 12:44 PM

## 2020-06-04 NOTE — Progress Notes (Signed)
Pt agitated. Up and out of bed with wondering tendency. Haldol 1 mg IM given. NP, BJon Billings notified of BP and Pulse. No new orders at this time.   06/04/20 0019  Assess: MEWS Score  Temp 97.7 F (36.5 C)  BP (!) 141/103  Pulse Rate (!) 138  Resp 19  Level of Consciousness Alert  SpO2 97 %  O2 Device Room Air  Assess: MEWS Score  MEWS Temp 0  MEWS Systolic 0  MEWS Pulse 3  MEWS RR 0  MEWS LOC 0  MEWS Score 3  MEWS Score Color Yellow  Assess: if the MEWS score is Yellow or Red  Were vital signs taken at a resting state? Yes  Focused Assessment No change from prior assessment  Early Detection of Sepsis Score *See Row Information* Medium  MEWS guidelines implemented *See Row Information* Yes  Treat  MEWS Interventions Escalated (See documentation below)  Escalate  MEWS: Escalate Yellow: discuss with charge nurse/RN and consider discussing with provider and RRT  Notify: Charge Nurse/RN  Name of Charge Nurse/RN Notified Jamison Neighbor Notified (this RN is Press photographer)  Date Charge Nurse/RN Notified 06/04/20  Time Charge Nurse/RN Notified 0030  Notify: Provider  Provider Name/Title Jon Billings NP  Date Provider Notified 06/04/20  Time Provider Notified [phone]  Notification Type Page (Epic message)  Notification Reason Change in status  Response No new orders  Date of Provider Response  (message marked seen in Epic messager, No typed response)  Time of Provider Response  (message marked seen in Epic messager, No typed response)

## 2020-06-04 NOTE — Progress Notes (Signed)
PHARMACIST - PHYSICIAN COMMUNICATION  CONCERNING:  Enoxaparin (Lovenox) for DVT Prophylaxis   RECOMMENDATION: Patient on enoxaprin 30mg  q24 hours for VTE prophylaxis.   Filed Weights   05/31/20 1818  Weight: 68 kg (150 lb)    Body mass index is 24.21 kg/m.  Estimated Creatinine Clearance: 31.6 mL/min (A) (by C-G formula based on SCr of 1.04 mg/dL (H)).  Patient is candidate for enoxaparin 40mg  every 24 hours based on CrCl now >25ml/min  DESCRIPTION: Pharmacy has adjusted enoxaparin dose per [facility] policy.  Patient is now receiving enoxaparin 40mg  every 24 hours.  , PharmD, BCPS Clinical Pharmacist 06/04/2020 12:27 PM

## 2020-06-04 NOTE — Plan of Care (Signed)
  Problem: Education: Goal: Ability to demonstrate management of disease process will improve Outcome: Completed/Met Goal: Ability to verbalize understanding of medication therapies will improve Outcome: Completed/Met Goal: Individualized Educational Video(s) Outcome: Completed/Met   Problem: Activity: Goal: Capacity to carry out activities will improve Outcome: Completed/Met   Problem: Cardiac: Goal: Ability to achieve and maintain adequate cardiopulmonary perfusion will improve Outcome: Completed/Met   

## 2020-06-10 ENCOUNTER — Other Ambulatory Visit: Payer: Self-pay

## 2020-06-10 ENCOUNTER — Emergency Department: Payer: Medicare Other

## 2020-06-10 ENCOUNTER — Encounter: Payer: Self-pay | Admitting: Emergency Medicine

## 2020-06-10 DIAGNOSIS — I4891 Unspecified atrial fibrillation: Secondary | ICD-10-CM | POA: Diagnosis not present

## 2020-06-10 DIAGNOSIS — I509 Heart failure, unspecified: Secondary | ICD-10-CM | POA: Diagnosis not present

## 2020-06-10 DIAGNOSIS — Z5321 Procedure and treatment not carried out due to patient leaving prior to being seen by health care provider: Secondary | ICD-10-CM | POA: Insufficient documentation

## 2020-06-10 LAB — BRAIN NATRIURETIC PEPTIDE: B Natriuretic Peptide: 626.9 pg/mL — ABNORMAL HIGH (ref 0.0–100.0)

## 2020-06-10 LAB — CBC
HCT: 39 % (ref 36.0–46.0)
Hemoglobin: 12.6 g/dL (ref 12.0–15.0)
MCH: 31.4 pg (ref 26.0–34.0)
MCHC: 32.3 g/dL (ref 30.0–36.0)
MCV: 97.3 fL (ref 80.0–100.0)
Platelets: 234 10*3/uL (ref 150–400)
RBC: 4.01 MIL/uL (ref 3.87–5.11)
RDW: 14.5 % (ref 11.5–15.5)
WBC: 5.3 10*3/uL (ref 4.0–10.5)
nRBC: 0 % (ref 0.0–0.2)

## 2020-06-10 LAB — BASIC METABOLIC PANEL
Anion gap: 8 (ref 5–15)
BUN: 16 mg/dL (ref 8–23)
CO2: 28 mmol/L (ref 22–32)
Calcium: 9.4 mg/dL (ref 8.9–10.3)
Chloride: 100 mmol/L (ref 98–111)
Creatinine, Ser: 1.05 mg/dL — ABNORMAL HIGH (ref 0.44–1.00)
GFR calc Af Amer: 53 mL/min — ABNORMAL LOW (ref >60)
GFR calc non Af Amer: 46 mL/min — ABNORMAL LOW (ref >60)
Glucose, Bld: 100 mg/dL — ABNORMAL HIGH (ref 70–99)
Potassium: 4.6 mmol/L (ref 3.5–5.1)
Sodium: 136 mmol/L (ref 135–145)

## 2020-06-10 LAB — PROTIME-INR
INR: 1.2 (ref 0.8–1.2)
Prothrombin Time: 14.4 seconds (ref 11.4–15.2)

## 2020-06-10 MED ORDER — SODIUM CHLORIDE 0.9% FLUSH: 3.0000 mL | Freq: Once | INTRAVENOUS | Status: DC

## 2020-06-10 NOTE — ED Triage Notes (Signed)
Pt from [facility] with afib. Pt does not know why ems was called or what was going on with her. Pt alert & oriented to self only.  NAD only.  EMS reports new CHF and Afib; gained 15# since August 3.

## 2020-06-11 ENCOUNTER — Telehealth: Payer: Self-pay | Admitting: Emergency Medicine

## 2020-06-11 ENCOUNTER — Emergency Department
Admission: EM | Admit: 2020-06-11 | Discharge: 2020-06-11 | Disposition: A | Discharge status: Home / Self Care | Payer: Medicare Other | Attending: Emergency Medicine | Admitting: Emergency Medicine

## 2020-06-11 LAB — TROPONIN I (HIGH SENSITIVITY): Troponin I (High Sensitivity): 23 ng/L — ABNORMAL HIGH (ref <18)

## 2020-06-11 NOTE — ED Notes (Signed)
No answer when called from lobby 

## 2020-06-11 NOTE — Telephone Encounter (Signed)
Called patient due to lwot to inquire about condition and follow up plans.  Family member says patient is going to dr Lady Gary today and she will tell her sister to let the doctor know about labs done here.  I told her that the troponin was elevated, but I could not find a previous value from her admission.

## 2020-06-20 ENCOUNTER — Ambulatory Visit: Payer: Medicare Other | Admitting: Family

## 2020-12-03 DEATH — deceased

## 2022-01-10 IMAGING — CR DG CHEST 2V
1 series · 2 of 2 positions shown · non-contrast
Comparison: None.

CLINICAL DATA: Cough, volume overload

EXAM:
CHEST - 2 VIEW

[Series 1: dg chest 2 view · 0.14mm/px · 2 of 2 slices shown]
[im 1/2]
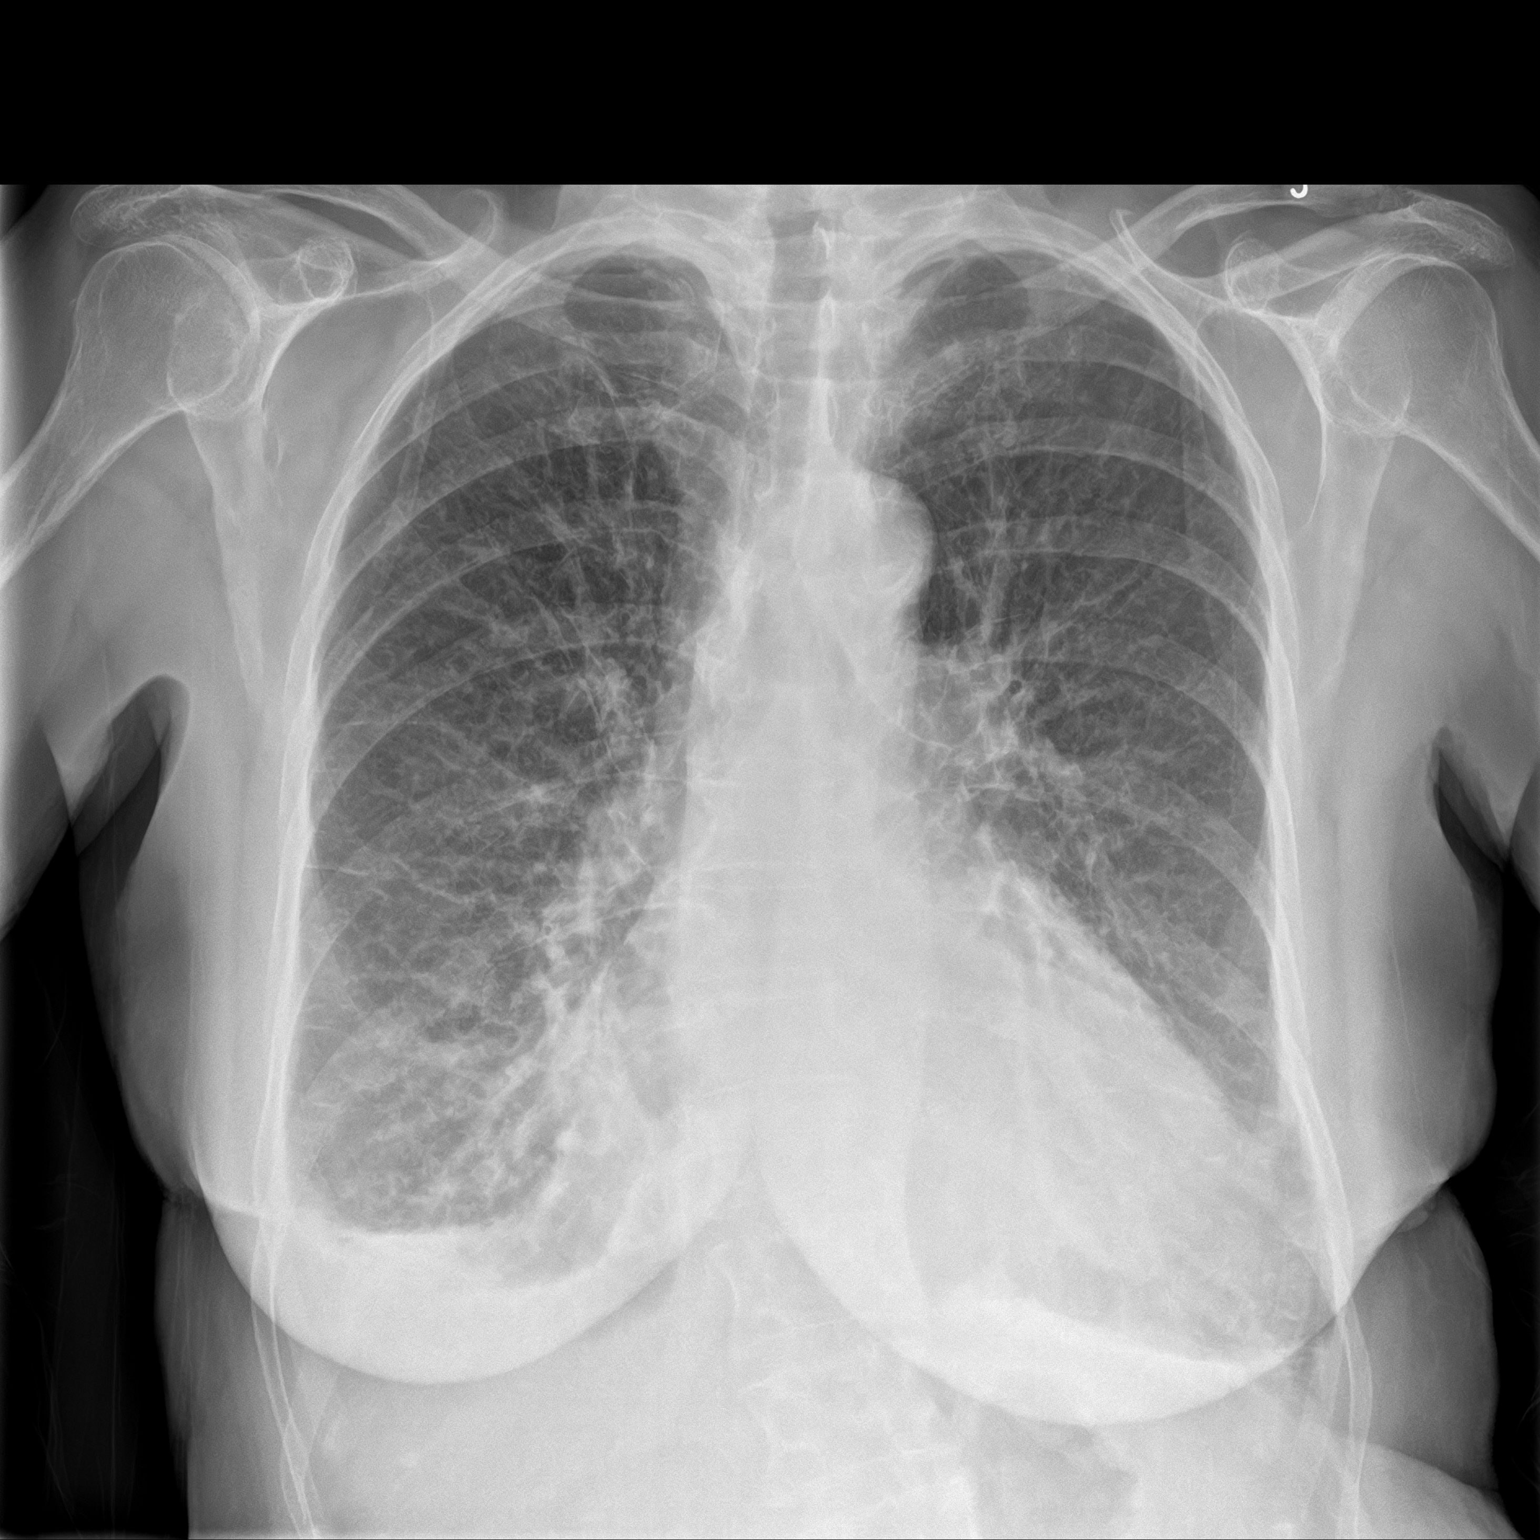
[im 2/2]
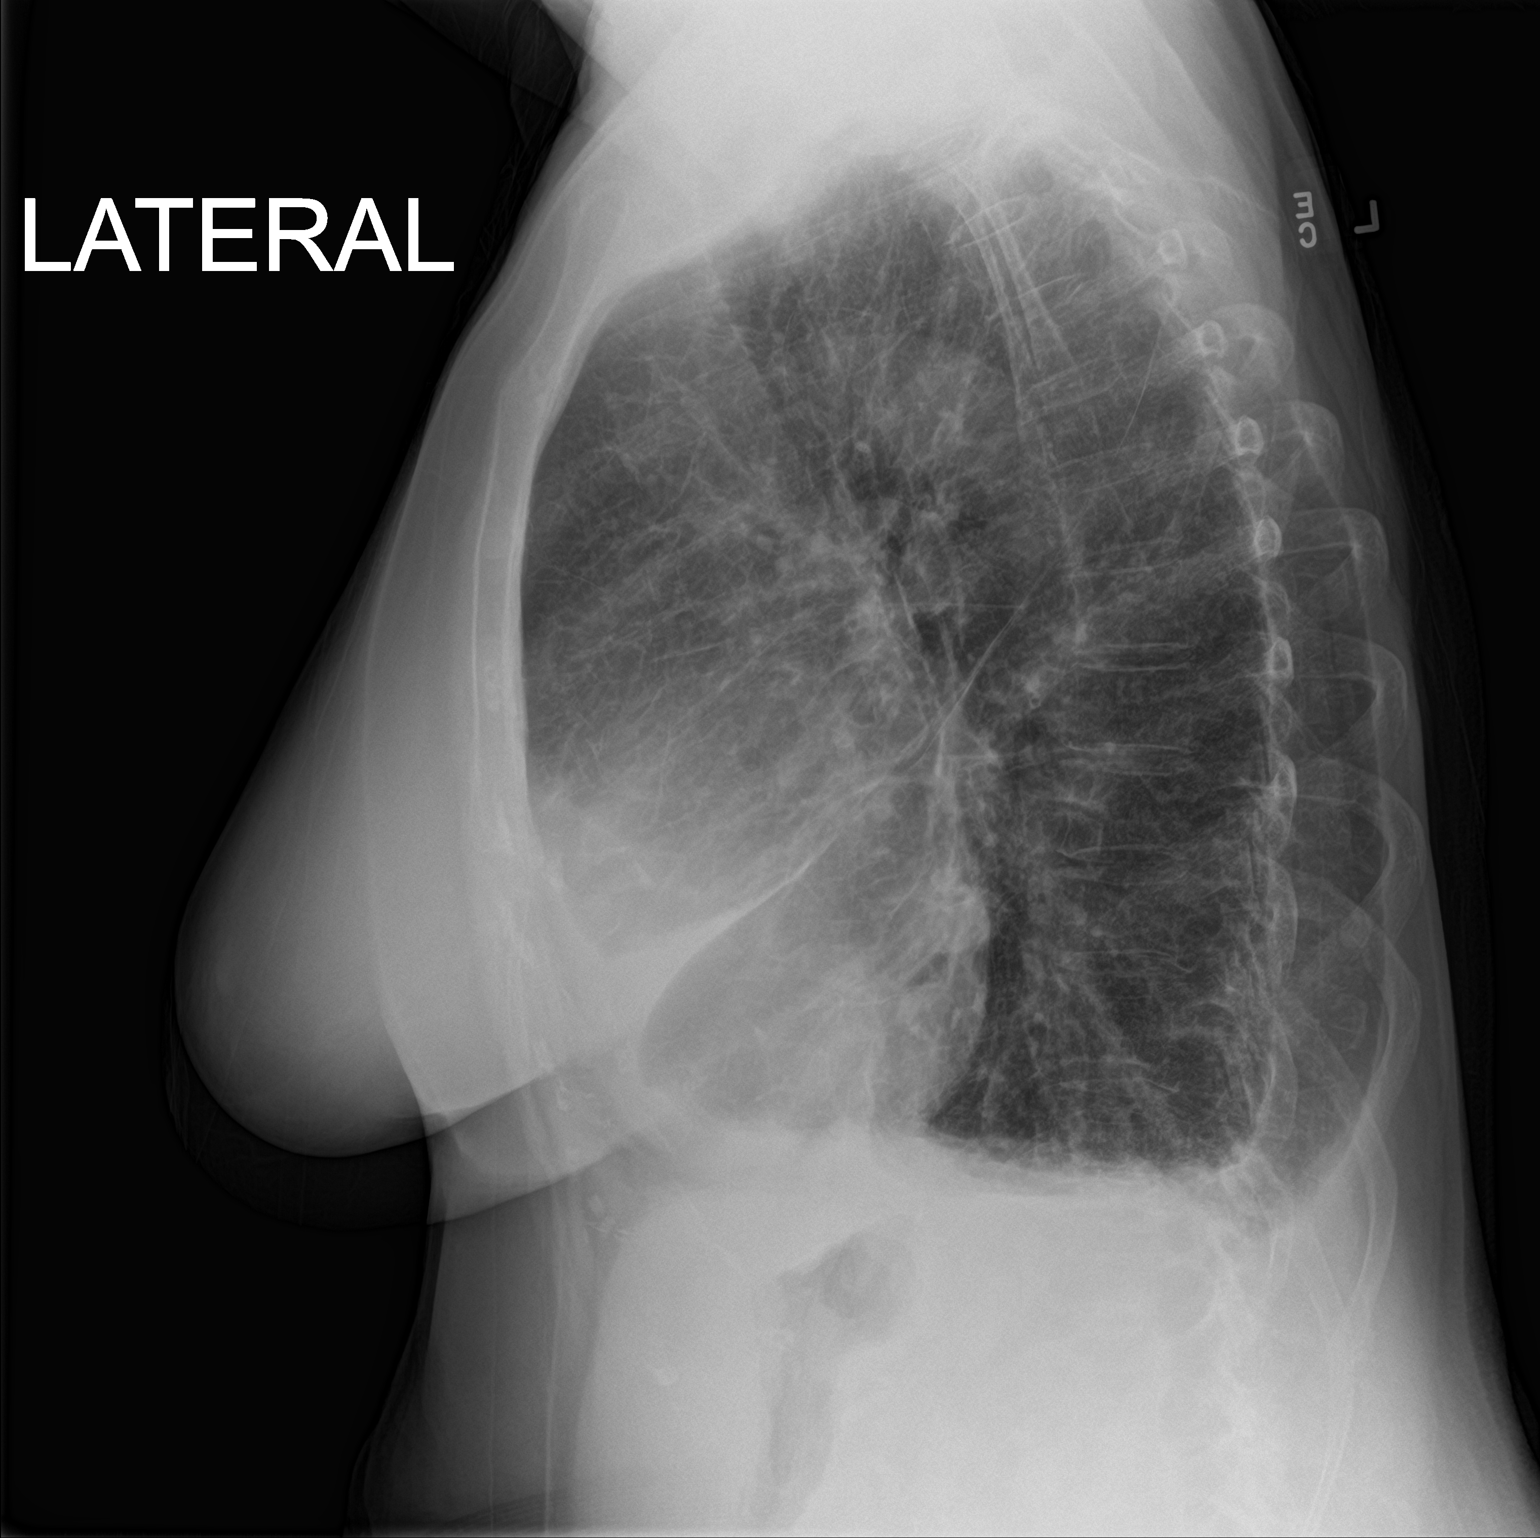

[2 of 2 positions shown; findings below may reference images not displayed]

FINDINGS: There is mild cardiomegaly. Aortic knob calcifications are seen.
Diffusely increased interstitial markings are seen throughout both
lungs. There are small bilateral pleural effusions. No acute osseous
abnormality.
IMPRESSION: Cardiomegaly and interstitial edema.

Small bilateral pleural effusions.

## 2022-01-11 IMAGING — DX DG CHEST 1V
1 series · 1 of 1 positions shown · non-contrast
Comparison: Radiograph yesterday.

CLINICAL DATA: [AGE] with increased confusion and bilateral
leg swelling. Shortness of breath.

EXAM:
CHEST  1 VIEW

[chest ap]
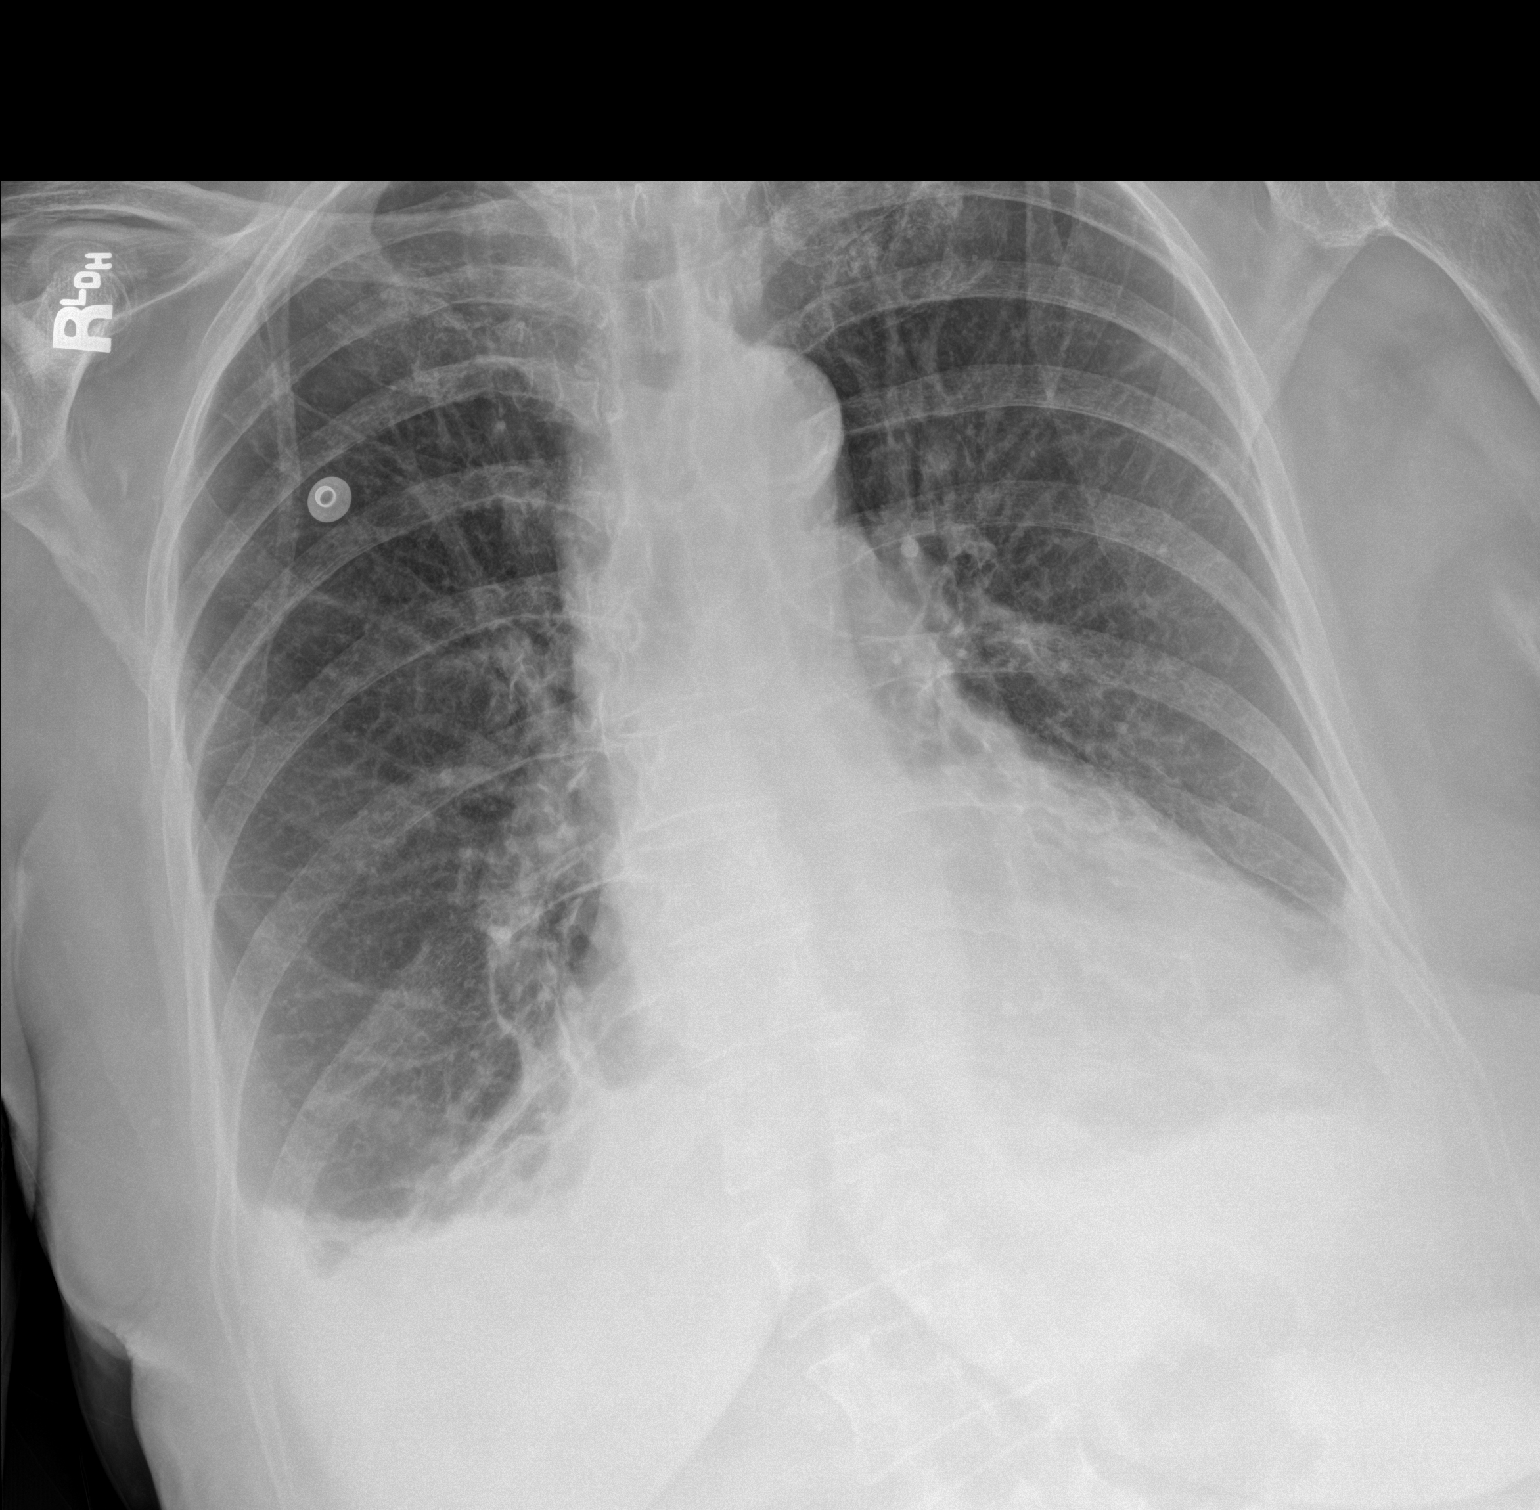

[1 of 1 positions shown; findings below may reference images not displayed]

FINDINGS: Stable cardiomegaly. Improved pulmonary edema. Small bilateral
pleural effusions, increased on the left from prior exam. No
confluent airspace disease. No pneumothorax. Stable osseous
structures.
IMPRESSION: CHF. Improved pulmonary edema but slight increase in left pleural
effusion since yesterday.
